# Patient Record
Sex: Male | Born: 1970 | Race: White | Hispanic: No | Marital: Married | Smoking: Former smoker
Health system: Southern US, Community
[De-identification: ages and names within clinical notes are randomized; demographics above are authoritative.]

## PROBLEM LIST (undated history)

## (undated) DIAGNOSIS — T7840XA Allergy, unspecified, initial encounter: Secondary | ICD-10-CM

## (undated) DIAGNOSIS — K219 Gastro-esophageal reflux disease without esophagitis: Secondary | ICD-10-CM

## (undated) HISTORY — DX: Gastro-esophageal reflux disease without esophagitis: K21.9

## (undated) HISTORY — PX: VASECTOMY: SHX75

## (undated) HISTORY — DX: Allergy, unspecified, initial encounter: T78.40XA

---

## 2009-10-27 ENCOUNTER — Encounter: Admission: RE | Admit: 2009-10-27 | Discharge: 2009-10-27 | Payer: Self-pay | Admitting: Internal Medicine

## 2010-12-21 ENCOUNTER — Ambulatory Visit
Admission: RE | Admit: 2010-12-21 | Discharge: 2010-12-21 | Payer: Self-pay | Source: Home / Self Care | Attending: Internal Medicine | Admitting: Internal Medicine

## 2010-12-21 ENCOUNTER — Other Ambulatory Visit: Payer: Self-pay | Admitting: Internal Medicine

## 2010-12-21 DIAGNOSIS — F410 Panic disorder [episodic paroxysmal anxiety] without agoraphobia: Secondary | ICD-10-CM | POA: Insufficient documentation

## 2010-12-21 DIAGNOSIS — G4730 Sleep apnea, unspecified: Secondary | ICD-10-CM | POA: Insufficient documentation

## 2010-12-21 DIAGNOSIS — K219 Gastro-esophageal reflux disease without esophagitis: Secondary | ICD-10-CM | POA: Insufficient documentation

## 2010-12-21 DIAGNOSIS — J309 Allergic rhinitis, unspecified: Secondary | ICD-10-CM | POA: Insufficient documentation

## 2010-12-21 DIAGNOSIS — R030 Elevated blood-pressure reading, without diagnosis of hypertension: Secondary | ICD-10-CM | POA: Insufficient documentation

## 2010-12-21 LAB — CBC WITH DIFFERENTIAL/PLATELET
Basophils Absolute: 0 10*3/uL (ref 0.0–0.1)
Basophils Relative: 0.4 % (ref 0.0–3.0)
Eosinophils Absolute: 0.2 10*3/uL (ref 0.0–0.7)
Eosinophils Relative: 2.3 % (ref 0.0–5.0)
HCT: 46.8 % (ref 39.0–52.0)
Hemoglobin: 16.8 g/dL (ref 13.0–17.0)
Lymphocytes Relative: 27.5 % (ref 12.0–46.0)
Lymphs Abs: 1.9 10*3/uL (ref 0.7–4.0)
MCHC: 35.8 g/dL (ref 30.0–36.0)
MCV: 91.9 fl (ref 78.0–100.0)
Monocytes Absolute: 0.7 10*3/uL (ref 0.1–1.0)
Monocytes Relative: 9.7 % (ref 3.0–12.0)
Neutro Abs: 4.1 10*3/uL (ref 1.4–7.7)
Neutrophils Relative %: 60.1 % (ref 43.0–77.0)
Platelets: 221 10*3/uL (ref 150.0–400.0)
RBC: 5.1 Mil/uL (ref 4.22–5.81)
RDW: 12.5 % (ref 11.5–14.6)
WBC: 6.8 10*3/uL (ref 4.5–10.5)

## 2010-12-21 LAB — BASIC METABOLIC PANEL
BUN: 16 mg/dL (ref 6–23)
CO2: 29 mEq/L (ref 19–32)
Calcium: 9.9 mg/dL (ref 8.4–10.5)
Chloride: 103 mEq/L (ref 96–112)
Creatinine, Ser: 0.9 mg/dL (ref 0.4–1.5)
GFR: 105.59 mL/min (ref >60.00)
Glucose, Bld: 92 mg/dL (ref 70–99)
Potassium: 4.4 mEq/L (ref 3.5–5.1)
Sodium: 141 mEq/L (ref 135–145)

## 2010-12-21 LAB — TSH: TSH: 1.44 u[IU]/mL (ref 0.35–5.50)

## 2010-12-21 LAB — LIPID PANEL
Cholesterol: 226 mg/dL — ABNORMAL HIGH (ref 0–200)
HDL: 31.4 mg/dL — ABNORMAL LOW (ref >39.00)
Total CHOL/HDL Ratio: 7
Triglycerides: 201 mg/dL — ABNORMAL HIGH (ref 0.0–149.0)
VLDL: 40.2 mg/dL — ABNORMAL HIGH (ref 0.0–40.0)

## 2010-12-21 LAB — HEPATIC FUNCTION PANEL
ALT: 34 U/L (ref 0–53)
AST: 39 U/L — ABNORMAL HIGH (ref 0–37)
Albumin: 4.5 g/dL (ref 3.5–5.2)
Alkaline Phosphatase: 60 U/L (ref 39–117)
Bilirubin, Direct: 0.1 mg/dL (ref 0.0–0.3)
Total Bilirubin: 1.4 mg/dL — ABNORMAL HIGH (ref 0.3–1.2)
Total Protein: 8.1 g/dL (ref 6.0–8.3)

## 2010-12-21 LAB — LDL CHOLESTEROL, DIRECT: Direct LDL: 169.9 mg/dL

## 2010-12-22 ENCOUNTER — Encounter: Payer: Self-pay | Admitting: Internal Medicine

## 2010-12-31 IMAGING — CT CT PARANASAL SINUSES LIMITED
1 series · 10 of 12 positions shown, 13 images · non-contrast
Comparison: None.

CLINICAL DATA: Opacification right maxillary sinus.

CT PARANASAL SINUS LIMITED WITHOUT CONTRAST
TECHNIQUE: Multidetector CT images of the paranasal sinuses were
obtained in a single plane without contrast.

[cor soft * · axial · 0.29mm/px · z∈[-300,-205]mm · 10 of 12 slices shown, 13 images]
[im 2/12  brain]
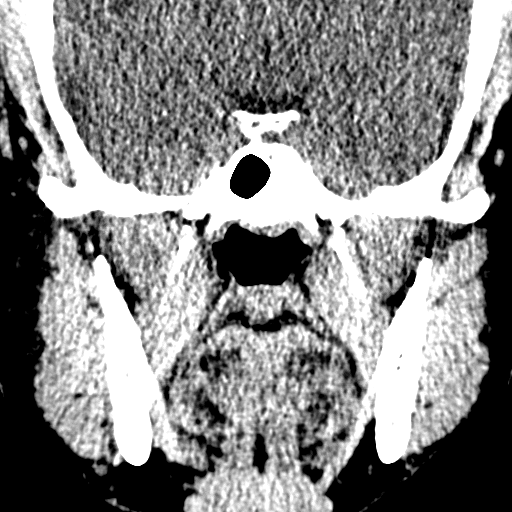
[im 2/12  bone]
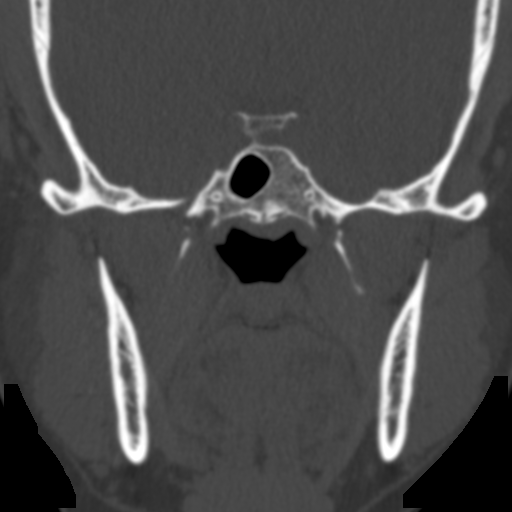
[im 3/12  bone]
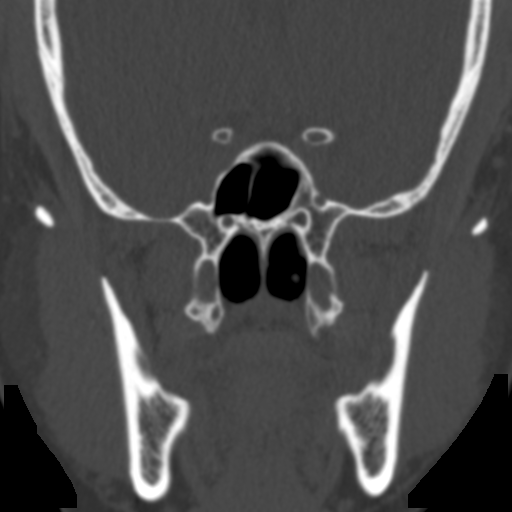
[im 4/12  bone]
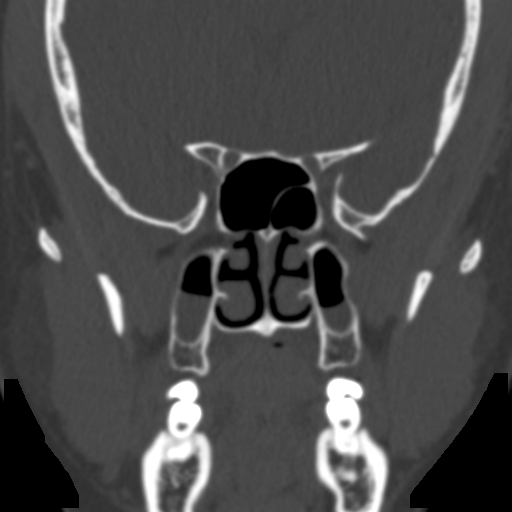
[im 5/12  bone]
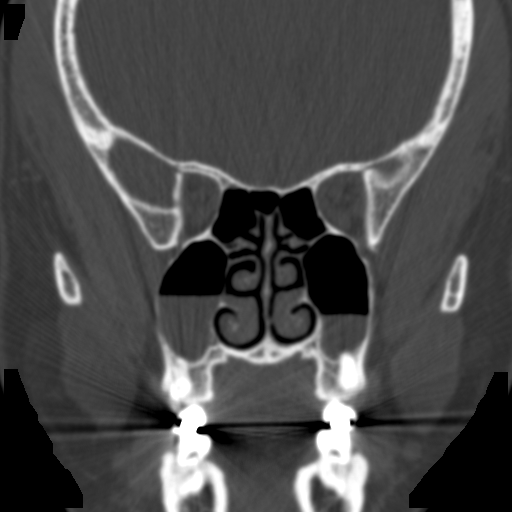
[im 6/12  brain]
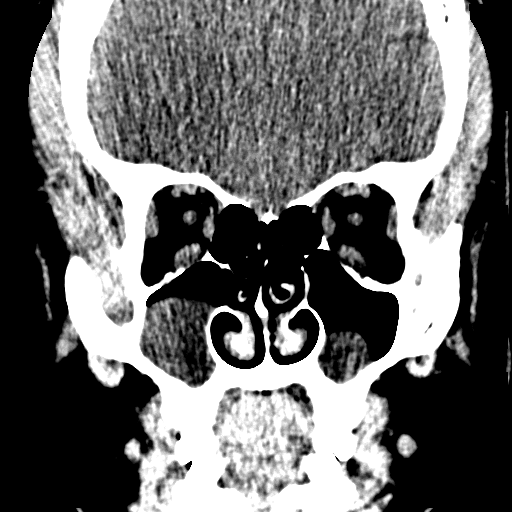
[im 6/12  bone]
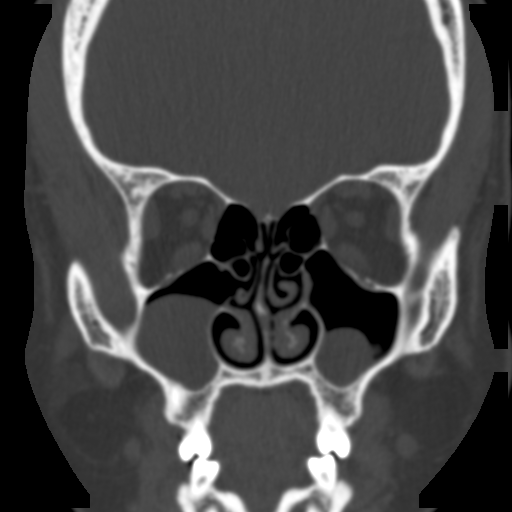
[im 7/12  bone]
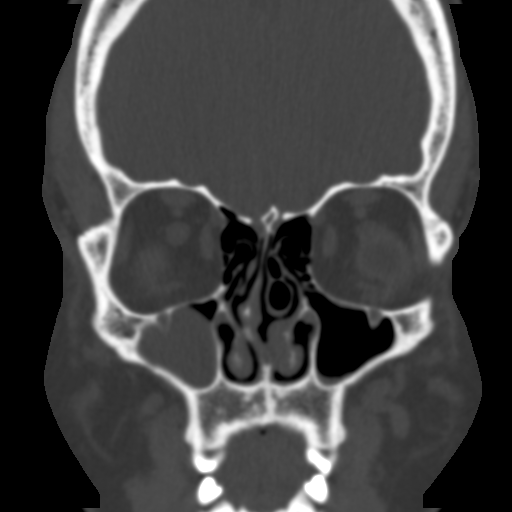
[im 8/12  bone]
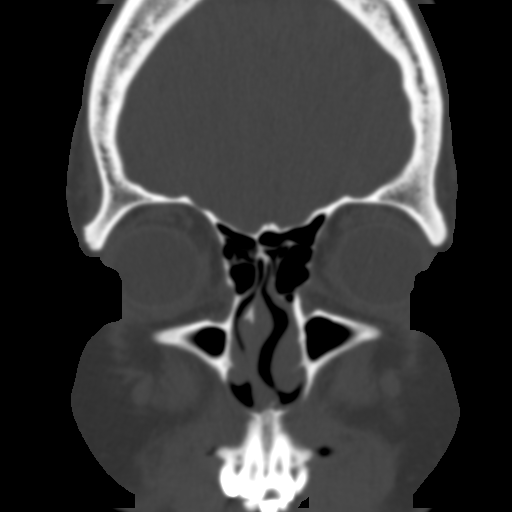
[im 9/12  bone]
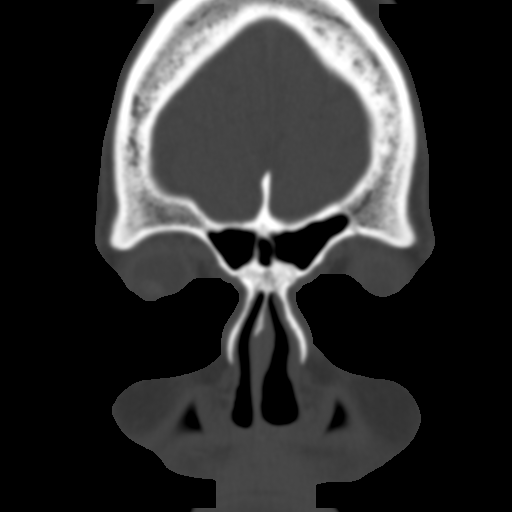
[im 10/12  brain]
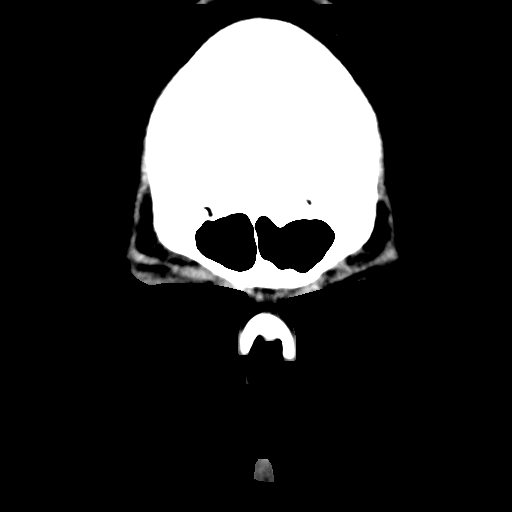
[im 10/12  bone]
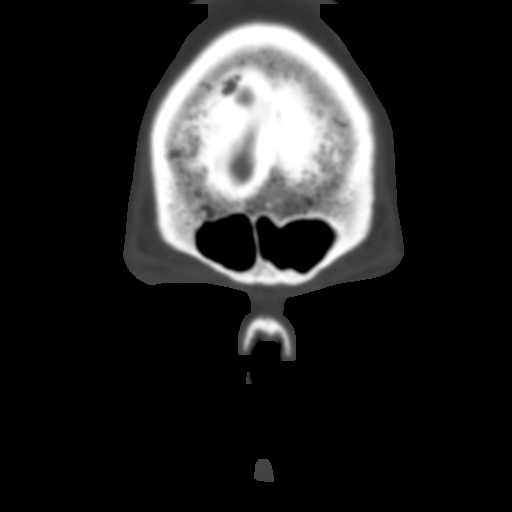
[im 11/12  bone]
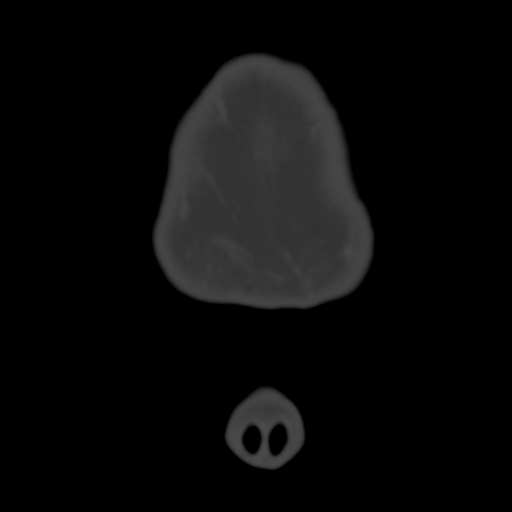

[10 of 12 positions shown; findings below may reference images not displayed]

FINDINGS: Polypoid opacification with air fluid level within the
maxillary sinuses.  This may represent a combination of retention
cyst/polyp in addition to sinusitis.
IMPRESSION: Polypoid opacification with air fluid level within the maxillary
sinuses.  This may represent a combination of retention cyst/polyp
in addition to sinusitis.

## 2011-01-05 ENCOUNTER — Ambulatory Visit
Admission: RE | Admit: 2011-01-05 | Discharge: 2011-01-05 | Payer: Self-pay | Source: Home / Self Care | Attending: Pulmonary Disease | Admitting: Pulmonary Disease

## 2011-01-05 DIAGNOSIS — R0989 Other specified symptoms and signs involving the circulatory and respiratory systems: Secondary | ICD-10-CM

## 2011-01-05 DIAGNOSIS — R0609 Other forms of dyspnea: Secondary | ICD-10-CM | POA: Insufficient documentation

## 2011-01-14 NOTE — Assessment & Plan Note (Signed)
Summary: NEW [employer] PT--#--PKG/OFF--STC   Vital Signs:  Patient profile:   40 year old male Height:      73 inches Weight:      277 pounds BMI:     36.68 O2 Sat:      95 % on Room air Temp:     98.6 degrees F oral Pulse rate:   79 / minute Pulse rhythm:   regular Resp:     16 per minute BP sitting:   142 / 84  (left arm) Cuff size:   large  Vitals Entered By: Rock Nephew CMA (December 21, 2010 1:29 PM)  Nutrition Counseling: Patient's BMI is greater than 25 and therefore counseled on weight management options.  O2 Flow:  Room air CC: New to establish Is Patient Diabetic? No Pain Assessment Patient in pain? no       Does patient need assistance? Functional Status Self care Ambulation Normal   Primary Care Provider:  Etta Grandchild MD  CC:  New to establish.  History of Present Illness: New to me he tells me that he has had a rare, sporadic feeling of panic for several years now with no preceeding event or behaviour that precipitates it. He feels very well in between the episodes. When the panic begins he has the sudden feeling of doom, with transient numbness in his arms, his wife says that his face can look pale or red, and he has the sensation that he is going to die. He does not feel anxious or depressed. The episodes spontaneously resolve.  Preventive Screening-Counseling & Management  Alcohol-Tobacco     Alcohol drinks/day: 3     Alcohol type: all     >5/day in last 3 mos: yes     Alcohol Counseling: to decrease amount and/or frequency of alcohol intake     Feels need to cut down: yes     Feels annoyed by complaints: no     Feels guilty re: drinking: no     Needs 'eye opener' in am: no     Smoking Status: never     Tobacco Counseling: not indicated; no tobacco use  Caffeine-Diet-Exercise     Does Patient Exercise: yes  Hep-HIV-STD-Contraception     Hepatitis Risk: no risk noted     HIV Risk: no risk noted     STD Risk: no risk noted      Sexual  History:  currently monogamous.        Drug Use:  no.        Blood Transfusions:  no.    Medications Prior to Update: 1)  None  Current Medications (verified): 1)  Zyrtec Allergy 10 Mg Tabs (Cetirizine Hcl) .... As Needed 2)  Zantac 150 Mg Tabs (Ranitidine Hcl) .... As Needed Otc 3)  Fluticasone Propionate 50 Mcg/act Susp (Fluticasone Propionate) .... 2 Puffs Each Nostril Once Daily 4)  Clonazepam 1 Mg Tabs (Clonazepam) .... 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  The patient denies anorexia, fever, weight loss, chest pain, syncope, dyspnea on exertion, peripheral edema, prolonged cough, headaches, hemoptysis, abdominal pain, melena, hematochezia, severe indigestion/heartburn, hematuria, suspicious skin lesions, difficulty walking, depression, unusual weight change, abnormal bleeding, and enlarged lymph nodes.   CV:  Denies chest pain or discomfort, difficulty breathing at night, difficulty breathing while lying down, fainting, fatigue, leg cramps with exertion, lightheadness, near fainting, palpitations, shortness of breath with exertion, and swelling of feet. Resp:  Complains of excessive snoring, hypersomnolence, and morning headaches; denies chest discomfort, chest pain with inspiration, cough, coughing up blood, pleuritic,  shortness of breath, sputum productive, and wheezing.  Physical Exam  General:  alert, well-developed, well-nourished, well-hydrated, appropriate dress, normal appearance, healthy-appearing, cooperative to examination, and overweight-appearing.   Head:  normocephalic, atraumatic, no abnormalities observed, and no abnormalities palpated.   Eyes:  vision grossly intact and pupils equal.   Ears:  R ear normal and L ear normal.   Mouth:  Oral mucosa and oropharynx without lesions or exudates.  Teeth in good repair. Neck:  supple, full ROM, no masses, no thyromegaly, no JVD, normal carotid upstroke, no carotid bruits, no cervical lymphadenopathy, and no neck tenderness.   Lungs:  normal respiratory effort, no intercostal retractions, no accessory muscle use, normal breath sounds, no dullness, no fremitus, no crackles, and no wheezes.   Heart:  normal rate, regular rhythm, no murmur, no gallop, no rub, and no JVD.   Abdomen:  soft, non-tender, normal bowel sounds, no distention, no masses, no guarding, no rigidity, no rebound tenderness, no abdominal hernia, no inguinal hernia, no hepatomegaly, and no splenomegaly.   Msk:  No deformity or scoliosis noted of thoracic or lumbar spine.   Pulses:  R and L carotid,radial,femoral,dorsalis pedis and posterior tibial pulses are full and equal bilaterally Extremities:  No clubbing, cyanosis, edema, or deformity noted with normal full range of motion of all joints.   Neurologic:  No cranial nerve deficits noted. Station and gait are normal. Plantar reflexes are down-going bilaterally. DTRs are symmetrical throughout. Sensory, motor and coordinative functions appear intact. Skin:  Intact without suspicious lesions or rashes Cervical Nodes:  No lymphadenopathy noted Axillary Nodes:  No palpable lymphadenopathy Psych:  Cognition and judgment appear intact. Alert and cooperative with normal attention span and concentration. No apparent delusions, illusions,  hallucinations Additional Exam:  His EKG has poor RWP that looks like a normal variant   Impression & Recommendations:  Problem # 1:  ELEVATED BP W/O HYPERTENSION (ICD-796.2) Assessment New  Orders: Venipuncture ([address]) TLB-Lipid Panel (80061-LIPID) TLB-BMP (Basic Metabolic Panel-BMET) (80048-METABOL) TLB-CBC Platelet - w/Differential (85025-CBCD) TLB-Hepatic/Liver Function Pnl (80076-HEPATIC) TLB-TSH (Thyroid Stimulating Hormone) (84443-TSH) EKG w/ Interpretation (93000)  BP today: 142/84  Instructed in low sodium diet (DASH Handout) and behavior modification.    Problem # 2:  PANIC DISORDER (ICD-300.01) Assessment: New  His updated medication list for this problem includes:    Clonazepam 1 Mg Tabs (Clonazepam) ..... One by mouth two times a day as needed for anxiety  Orders: Venipuncture (60454) TLB-Lipid Panel (80061-LIPID) TLB-BMP (Basic Metabolic Panel-BMET) (80048-METABOL) TLB-CBC Platelet - w/Differential (85025-CBCD) TLB-Hepatic/Liver Function Pnl (80076-HEPATIC) TLB-TSH (Thyroid Stimulating Hormone) (84443-TSH)  Problem # 3:  SLEEP APNEA (ICD-780.57) Assessment: New  Orders: Venipuncture (09811) TLB-Lipid Panel (80061-LIPID) TLB-BMP (Basic Metabolic Panel-BMET) (80048-METABOL) TLB-CBC Platelet - w/Differential (85025-CBCD) TLB-Hepatic/Liver Function Pnl (80076-HEPATIC) TLB-TSH (Thyroid  Stimulating Hormone) (84443-TSH) Sleep Disorder Referral (Sleep Disorder)  Problem # 4:  GERD (ICD-530.81) Assessment: Improved  His updated medication list for this problem includes:    Zantac 150 Mg Tabs (Ranitidine hcl) .Marland Kitchen... As needed otc  Complete Medication List: 1)  Zyrtec Allergy 10 Mg Tabs (Cetirizine hcl) .... As needed 2)  Zantac 150 Mg Tabs (Ranitidine hcl) .... As needed otc 3)  Fluticasone Propionate 50 Mcg/act Susp (Fluticasone propionate) .... 2 puffs each nostril once daily 4)  Clonazepam 1 Mg Tabs (Clonazepam) .... One by mouth two times a day as  needed for anxiety  Other Orders: Tdap => 46yrs IM ([address]) Admin 1st Vaccine (60454)  Patient Instructions: 1)  Please schedule a follow-up appointment in 2 weeks. 2)  It is important that you exercise regularly at least 20 minutes 5 times a week. If you develop chest pain, have severe difficulty breathing, or feel very tired , stop exercising immediately and seek medical attention. 3)  You need to lose weight. Consider a lower calorie diet and regular exercise.  4)  Check your Blood Pressure regularly. If it is above 140/90: you should make an appointment. Prescriptions: CLONAZEPAM 1 MG TABS (CLONAZEPAM) One by mouth two times a day as needed for anxiety  #60 x 2   Entered and Authorized by:   Etta Grandchild MD   Signed by:   Etta Grandchild MD on 12/21/2010   Method used:   Print then Give to Patient   RxID:   [id number] FLUTICASONE PROPIONATE 50 MCG/ACT SUSP (FLUTICASONE PROPIONATE) 2 puffs each nostril once daily  #1 inh x 11   Entered and Authorized by:   Etta Grandchild MD   Signed by:   Etta Grandchild MD on 12/21/2010   Method used:   Historical   RxID:   [id number]    Orders Added: 1)  Venipuncture [41324] 2)  TLB-Lipid Panel [80061-LIPID] 3)  TLB-BMP (Basic Metabolic Panel-BMET) [80048-METABOL] 4)  TLB-CBC Platelet - w/Differential [85025-CBCD] 5)  TLB-Hepatic/Liver Function Pnl [80076-HEPATIC] 6)  TLB-TSH (Thyroid Stimulating Hormone) [84443-TSH] 7)  Tdap => 72yrs IM [90715] 8)  Admin 1st Vaccine [90471] 9)  Sleep Disorder Referral [Sleep Disorder] 10)  EKG w/ Interpretation [93000] 11)  New Patient Level IV [40102]   Immunizations Administered:  Tetanus Vaccine:    Vaccine Type: Tdap    Site: left deltoid    Mfr: GlaxoSmithKline    Dose: 0.5 ml    Route: IM    Given by: Rock Nephew CMA    Exp. Date: 10/01/2012    Lot #: [id number]    VIS given: 10/30/08 version given December 21, 2010.   Immunizations Administered:  Tetanus Vaccine:     Vaccine Type: Tdap    Site: left deltoid    Mfr: GlaxoSmithKline    Dose: 0.5 ml    Route: IM    Given by: Rock Nephew CMA    Exp. Date: 10/01/2012    Lot #: [id number]    VIS given: 10/30/08 version given December 21, 2010.  Appended Document: NEW [employer] PT--#--PKG/OFF--STC    Clinical Lists Changes  Orders: Added new Service order of Tdap => 21yrs IM (75643) - Signed Added new Service order of Admin 1st Vaccine (32951) - Signed Observations: Added new observation of TD BOOST VIS: 10/30/08 version given December 22, 2010. (12/22/2010 8:41) Added new observation of TD BOOSTERLO: [id number] (12/22/2010 8:41) Added new observation of TD BOOST EXP: 10/01/2012 (12/22/2010 8:41) Added new observation  of TD BOOSTERBYAlvy Beal Archie CMA (12/22/2010 8:41) Added new observation of TD BOOSTERRT: IM (12/22/2010 8:41) Added new observation of TDBOOSTERDSE: 0.5 ml (12/22/2010 8:41) Added new observation of TD BOOSTERMF: GlaxoSmithKline (12/22/2010 8:41) Added new observation of TD BOOST SIT: right deltoid (12/22/2010 8:41) Added new observation of TD BOOSTER: Tdap (12/22/2010 8:41)       Immunizations Administered:  Tetanus Vaccine:    Vaccine Type: Tdap    Site: right deltoid    Mfr: GlaxoSmithKline    Dose: 0.5 ml    Route: IM    Given by: Rock Nephew CMA    Exp. Date: 10/01/2012    Lot #: [id number]    VIS given: 10/30/08 version given December 22, 2010.

## 2011-01-14 NOTE — Letter (Signed)
Summary: Lipid Letter  [facility]  [address] [place], [place] [address]   Phone: [phone]  Fax: [phone]    12/22/2010  Vernice Bowker [address] [place], [place]  [address]  Dear Casimiro Needle:  We have carefully reviewed your last lipid profile from  and the results are noted below with a summary of recommendations for lipid management.    Cholesterol:       226     Goal: <200 too high   HDL "good" Cholesterol:   57.84     Goal: >40 too low   LDL "bad" Cholesterol:   170     Goal: <130 too high   Triglycerides:       201.0     Goal: <150 too high        TLC Diet (Therapeutic Lifestyle Change): Saturated Fats & Transfatty acids should be kept < 7% of total calories ***Reduce Saturated Fats Polyunstaurated Fat can be up to 10% of total calories Monounsaturated Fat Fat can be up to 20% of total calories Total Fat should be no greater than 25-35% of total calories Carbohydrates should be 50-60% of total calories Protein should be approximately 15% of total calories Fiber should be at least 20-30 grams a day ***Increased fiber may help lower LDL Total Cholesterol should be < 200mg /day Consider adding plant stanol/sterols to diet (example: Benacol spread) ***A higher intake of unsaturated fat may reduce Triglycerides and Increase HDL    Adjunctive Measures (may lower LIPIDS and reduce risk of Heart Attack) include: Aerobic Exercise (20-30 minutes 3-4 times a week) Limit Alcohol Consumption Weight Reduction Aspirin 75-81 mg a day by mouth (if not allergic or contraindicated) Dietary Fiber 20-30 grams a day by mouth     Current Medications: 1)    Zyrtec Allergy 10 Mg Tabs (Cetirizine hcl) .... As needed 2)    Zantac 150 Mg Tabs (Ranitidine hcl) .... As needed otc 3)    Fluticasone Propionate 50 Mcg/act Susp (Fluticasone propionate) .... 2 puffs each nostril once daily 4)    Clonazepam 1 Mg Tabs (Clonazepam) .... One by mouth two times a day as needed for  anxiety  If you have any questions, please call. We appreciate being able to work with you.   Sincerely,    [facility] Etta Grandchild MD

## 2011-01-28 NOTE — Assessment & Plan Note (Signed)
Summary: consult for possible osa   Visit Type:  Initial Consult Copy to:  Dr. Sanda Linger Primary Provider/Referring Provider:  Etta Grandchild MD  CC:  Sleep Consultation.  History of Present Illness: The pt is a 40y/o male who I have been asked to see for possible osa.  He has been noted to have loud snoring, as well as an abnormal breathing pattern during sleep.  He also notes that he can awaken with his "heart racing".  He goes to bed around 11pm, and arises at 630am to start his day.  He feels that he is fairly rested upon arising.  He feels his alertness during the day is acceptable, but will have occasional sleep pressure with meetings.  He denies any sleep pressure with watching tv or movies in the evening.  He denies sleep pressure while driving.  His epworth score today is only 1.  His weight is up 40 pounds over the last 2 years.  Current Medications (verified): 1)  Zyrtec Allergy 10 Mg Tabs (Cetirizine Hcl) .... As Needed 2)  Zantac 150 Mg Tabs (Ranitidine Hcl) .... As Needed Otc 3)  Fluticasone Propionate 50 Mcg/act Susp (Fluticasone Propionate) .... 2 Puffs Each Nostril Once Daily 4)  Clonazepam 1 Mg Tabs (Clonazepam) .... One By Mouth Two Times A Day As Needed For Anxiety  Allergies (verified): No Known Drug Allergies  Past History:  Past Medical History: Reviewed history from 12/21/2010 and no changes required. Allergic rhinitis GERD  Past Surgical History: Vasectomy   Family History: Reviewed history from 12/21/2010 and no changes required. Family History of Arthritis Family History Diabetes 1st degree relative Allergies- Brother  Social History: Reviewed history from 12/21/2010 and no changes required. 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  Smoked for approx 11 yrs up to 2ppd Alcohol use-yes- 12 drinks per week Drug use-no Regular exercise-yes  Review of Systems       The patient complains of acid  heartburn, nasal congestion/difficulty breathing through nose, ear ache, and anxiety.  The patient denies shortness of breath with activity, shortness of breath at rest, productive cough, non-productive cough, coughing up blood, chest pain, irregular heartbeats, indigestion, loss of appetite, weight change, abdominal pain, difficulty swallowing, sore throat, tooth/dental problems, headaches, sneezing, itching, depression, hand/feet swelling, joint stiffness or pain, rash, change in color of mucus, and fever.    Vital Signs:  Patient profile:   40 year old male Weight:      283.25 pounds BMI:     37.51 O2 Sat:      97 % on Room air Temp:     98.1 degrees F oral Pulse rate:   78 / minute BP sitting:   134 / 88  (left arm) Cuff size:   large  Vitals Entered By: Vernie Murders (January 05, 2011 11:23 AM)  O2 Flow:  Room air  Physical Exam  General:  ow male in nad Eyes:  PERRLA and EOMI.   Nose:  congested with mucosal edema and deviated septum Mouth:  long palate and uvula with narrow space posteriorly Neck:  no jvd, tmg, LN Lungs:  clear to auscultation Heart:  rrr, no mrg Abdomen:  soft and nontender, bs+ Extremities:  no edema noted, pulses intact distally no cyanosis Neurologic:  alert and oriented, does not appear sleepy moves all 4 normally .   Impression & Recommendations:  Problem # 1:  SNORING (ICD-786.09) the pt has been noted to have  loud snoring, but has restorative sleep and reports no daytime sleepiness issues.  He really does not have a lot of underlying medical issues, and is really motivated to lose weight.  I suspect he has loud snoring, and possibly the upper airway resistance syndrome (UARS).  I cannot r/o mild osa, but I do not think he has clinically significant osa.  I think conservative management is adequate at this time, provided he is willing to followup if not making progress.  If he is able to lose weight, no further f/u needed.  If he is not able to make  progress with weight loss, would do home sleep testing.  Other Orders: Consultation Level IV ([address])  Patient Instructions: 1)  work on weight loss next 6mos.  If you decide that you are struggling with this, or if you are unsuccessful, will do a home sleep study.  Please let me know.

## 2011-02-04 ENCOUNTER — Encounter: Payer: Self-pay | Admitting: Internal Medicine

## 2011-02-04 ENCOUNTER — Telehealth (INDEPENDENT_AMBULATORY_CARE_PROVIDER_SITE_OTHER): Payer: Self-pay | Admitting: *Deleted

## 2011-02-04 ENCOUNTER — Other Ambulatory Visit: Payer: Self-pay | Admitting: Internal Medicine

## 2011-02-04 ENCOUNTER — Ambulatory Visit (INDEPENDENT_AMBULATORY_CARE_PROVIDER_SITE_OTHER): Payer: Managed Care, Other (non HMO) | Admitting: Internal Medicine

## 2011-02-04 ENCOUNTER — Ambulatory Visit (INDEPENDENT_AMBULATORY_CARE_PROVIDER_SITE_OTHER)
Admission: RE | Admit: 2011-02-04 | Discharge: 2011-02-04 | Disposition: A | Discharge status: Home / Self Care | Payer: Managed Care, Other (non HMO) | Source: Ambulatory Visit | Attending: Internal Medicine | Admitting: Internal Medicine

## 2011-02-04 DIAGNOSIS — R05 Cough: Secondary | ICD-10-CM

## 2011-02-04 DIAGNOSIS — R030 Elevated blood-pressure reading, without diagnosis of hypertension: Secondary | ICD-10-CM

## 2011-02-04 DIAGNOSIS — J209 Acute bronchitis, unspecified: Secondary | ICD-10-CM

## 2011-02-09 NOTE — Assessment & Plan Note (Signed)
Summary: dry sore throat   Vital Signs:  Patient profile:   40 year old male Height:      73 inches Weight:      280 pounds BMI:     37.08 O2 Sat:      99 % on Room air Temp:     97.6 degrees F oral Pulse rate:   84 / minute Pulse rhythm:   regular Resp:     16 per minute BP sitting:   120 / 80  (left arm) Cuff size:   large  Vitals Entered By: Lanier Prude, CMA(AAMA) (February 04, 2011 3:51 PM)  O2 Flow:  Room air CC: sore/dry throat & cough X 2 wks, URI symptoms Is Patient Diabetic? No Pain Assessment Patient in pain? no        Primary Care Provider:  Etta Grandchild MD  CC:  sore/dry throat & cough X 2 wks and URI symptoms.  History of Present Illness:  URI Symptoms      This is a 40 year old man who presents with URI symptoms.  The symptoms began 2 weeks ago.  The severity is described as mild.  The patient reports nasal congestion, clear nasal discharge, purulent nasal discharge, sore throat, and dry cough, but denies productive cough, earache, and sick contacts.  The patient denies fever, stiff neck, dyspnea, wheezing, rash, vomiting, diarrhea, use of an antipyretic, and response to antipyretic.  The patient also reports severe fatigue.  The patient denies itchy throat, sneezing, seasonal symptoms, headache, and muscle aches.  The patient denies the following risk factors for Strep sinusitis: unilateral facial pain, unilateral nasal discharge, poor response to decongestant, double sickening, tooth pain, Strep exposure, tender adenopathy, and absence of cough.    Preventive Screening-Counseling & Management  Alcohol-Tobacco     Alcohol drinks/day: 3     Alcohol type: all     >5/day in last 3 mos: yes     Alcohol Counseling: to decrease amount and/or frequency of alcohol intake     Feels need to cut down: yes     Feels annoyed by complaints: no     Feels guilty re: drinking: no     Needs 'eye opener' in am: no     Smoking Status: never     Tobacco Counseling:  not indicated; no tobacco use  Hep-HIV-STD-Contraception     Hepatitis Risk: no risk noted     HIV Risk: no risk noted     STD Risk: no risk noted      Sexual History:  currently monogamous.        Drug Use:  no.        Blood Transfusions:  no.    Clinical Review Panels:  Immunizations   Last Tetanus Booster:  Tdap (12/22/2010)  Lipid Management   Cholesterol:  226 (12/21/2010)   HDL (good cholesterol):  31.40 (12/21/2010)  Diabetes Management   Creatinine:  0.9 (12/21/2010)  CBC   WBC:  6.8 (12/21/2010)   RBC:  5.10 (12/21/2010)   Hgb:  16.8 (12/21/2010)   Hct:  46.8 (12/21/2010)   Platelets:  221.0 (12/21/2010)   MCV  91.9 (12/21/2010)   MCHC  35.8 (12/21/2010)   RDW  12.5 (12/21/2010)   PMN:  60.1 (12/21/2010)   Lymphs:  27.5 (12/21/2010)   Monos:  9.7 (12/21/2010)   Eosinophils:  2.3 (12/21/2010)   Basophil:  0.4 (12/21/2010)  Complete Metabolic Panel   Glucose:  92 (12/21/2010)   Sodium:  141 (  12/21/2010)   Potassium:  4.4 (12/21/2010)   Chloride:  103 (12/21/2010)   CO2:  29 (12/21/2010)   BUN:  16 (12/21/2010)   Creatinine:  0.9 (12/21/2010)   Albumin:  4.5 (12/21/2010)   Total Protein:  8.1 (12/21/2010)   Calcium:  9.9 (12/21/2010)   Total Bili:  1.4 (12/21/2010)   Alk Phos:  60 (12/21/2010)   SGPT (ALT):  34 (12/21/2010)   SGOT (AST):  39 (12/21/2010)   Medications Prior to Update: 1)  Zyrtec Allergy 10 Mg Tabs (Cetirizine Hcl) .... As Needed 2)  Zantac 150 Mg Tabs (Ranitidine Hcl) .... As Needed Otc 3)  Fluticasone Propionate 50 Mcg/act Susp (Fluticasone Propionate) .... 2 Puffs Each Nostril Once Daily 4)  Clonazepam 1 Mg Tabs (Clonazepam) .... One By Mouth Two Times A Day As Needed For Anxiety  Current Medications (verified): 1)  Zyrtec Allergy 10 Mg Tabs (Cetirizine Hcl) .... As Needed 2)  Zantac 150 Mg Tabs (Ranitidine Hcl) .... As Needed Otc 3)  Fluticasone Propionate 50 Mcg/act Susp (Fluticasone Propionate) .... 2 Puffs Each Nostril  Once Daily 4)  Clonazepam 1 Mg Tabs (Clonazepam) .... One By Mouth Two Times A Day As Needed For Anxiety 5)  Rezira 60-5 Mg/57ml Soln (Pseudoeph-Hydrocodone) .... 5 Ml By Mouth Qid As Needed For Cough and Congestion 6)  Zithromax Tri-Pak 500 Mg Tab (Azithromycin) .... 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  Smoked for approx 11 yrs up to 2ppd Alcohol use-yes- 12 drinks per week Drug use-no Regular exercise-yes  Risk Factors: Alcohol Use: 3 (02/04/2011) >5 drinks/d w/in last 3 months: yes (02/04/2011) Exercise: yes (12/21/2010)  Risk Factors: Smoking Status: never (02/04/2011)  Family History: Reviewed history from 01/05/2011 and no changes required. Family History of Arthritis Family History Diabetes 1st degree relative Allergies- Brother  Social History: Reviewed history from 01/05/2011 and no changes required. 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  Smoked for approx 11 yrs up to 2ppd Alcohol use-yes- 12 drinks per week Drug use-no Regular exercise-yes  Review of Systems       The patient complains of hoarseness and prolonged cough.  The patient denies anorexia, fever, weight loss, weight gain, decreased hearing, chest pain, syncope, dyspnea on exertion, peripheral edema, headaches, hemoptysis, abdominal pain, suspicious skin lesions, difficulty walking, depression, and enlarged lymph nodes.    Physical Exam  General:  alert, well-developed,  well-nourished, well-hydrated, appropriate dress, normal appearance, healthy-appearing, cooperative to examination, and overweight-appearing.   Head:  normocephalic, atraumatic, no abnormalities observed, and no abnormalities palpated.   Ears:  R ear normal and L ear normal.   Nose:  External nasal examination shows no deformity or inflammation. Nasal mucosa are pink and moist without lesions or exudates. Mouth:  Oral mucosa and oropharynx without lesions or exudates.  Teeth in good repair. Neck:  supple, full ROM, no masses, no thyromegaly, no JVD, normal carotid upstroke, no carotid bruits, no cervical lymphadenopathy, and no neck tenderness.   Lungs:  normal respiratory effort, no intercostal retractions, no accessory muscle use, normal breath  sounds, no dullness, no fremitus, no crackles, and no wheezes.   Heart:  normal rate, regular rhythm, no murmur, no gallop, no rub, and no JVD.   Abdomen:  soft, non-tender, normal bowel sounds, no distention, no masses, no guarding, no rigidity, no rebound tenderness, no abdominal hernia, no inguinal hernia, no hepatomegaly, and no splenomegaly.   Msk:  No deformity or scoliosis noted of thoracic or lumbar spine.   Pulses:  R and L carotid,radial,femoral,dorsalis pedis and posterior tibial pulses are full and equal bilaterally Extremities:  No clubbing, cyanosis, edema, or deformity noted with normal full range of motion of all joints.   Neurologic:  No cranial nerve deficits noted. Station and gait are normal. Plantar reflexes are down-going bilaterally. DTRs are symmetrical throughout. Sensory, motor and coordinative functions appear intact. Skin:  Intact without suspicious lesions or rashes Cervical Nodes:  No lymphadenopathy noted Axillary Nodes:  No palpable lymphadenopathy Psych:  Cognition and judgment appear intact. Alert and cooperative with normal attention span and concentration. No apparent delusions, illusions, hallucinations   Impression &  Recommendations:  Problem # 1:  BRONCHITIS-ACUTE (ICD-466.0) Assessment New  His updated medication list for this problem includes:    Rezira 60-5 Mg/60ml Soln (Pseudoeph-hydrocodone) .Marland KitchenMarland KitchenMarland KitchenMarland Kitchen 5 ml by mouth qid as needed for cough and congestion    Zithromax Tri-pak 500 Mg Tab (Azithromycin) .Marland Kitchen... Take one by mouth once daily for 3 days  Take antibiotics and other medications as directed. Encouraged to push clear liquids, get enough rest, and take acetaminophen as needed. To be seen in 5-7 days if no improvement, sooner if worse.  Problem # 2:  COUGH (ICD-786.2) Assessment: New will check for pneumonia Orders: T-2 View CXR (71020TC)  Problem # 3:  ELEVATED BP W/O HYPERTENSION (ICD-796.2) Assessment: Improved  BP today: 120/80 Prior BP: 134/88 (01/05/2011)  Labs Reviewed: Creat: 0.9 (12/21/2010) Chol: 226 (12/21/2010)   HDL: 31.40 (12/21/2010)   TG: 201.0 (12/21/2010)  Instructed in low sodium diet (DASH Handout) and behavior modification.    Complete Medication List: 1)  Zyrtec Allergy 10 Mg Tabs (Cetirizine hcl) .... As needed 2)  Zantac 150 Mg Tabs (Ranitidine hcl) .... As needed otc 3)  Fluticasone Propionate 50 Mcg/act Susp (Fluticasone propionate) .... 2 puffs each nostril once daily 4)  Clonazepam 1 Mg Tabs (Clonazepam) .... One by mouth two times a day as needed for anxiety 5)  Rezira 60-5 Mg/90ml Soln (Pseudoeph-hydrocodone) .... 5 ml by mouth qid as needed for cough and congestion 6)  Zithromax Tri-pak 500 Mg Tab (Azithromycin) .... Take one by mouth once daily for 3 days  Patient Instructions: 1)  Please schedule a follow-up appointment in 2 weeks. 2)  Take your antibiotic as prescribed until ALL of it is gone, but stop if you develop a rash or swelling and contact our office as soon as possible. 3)  Acute bronchitis symptoms for less than 10 days are not helped by antibiotics. take over the counter cough medications. call if no improvment in  5-7 days, sooner if  increasing cough, fever, or new symptoms( shortness of breath, chest pain). Prescriptions: ZITHROMAX TRI-PAK 500 MG TAB (AZITHROMYCIN) Take one by mouth once daily for 3 days  #3 x 0   Entered and Authorized by:   Etta Grandchild MD   Signed by:   Etta Grandchild MD on 02/04/2011   Method used:   Print then Give to Patient   RxID:   [id number] REZIRA 60-5 MG/5ML SOLN (PSEUDOEPH-HYDROCODONE) 5 ml by  mouth QID as needed for cough and congestion  #8 ounces x 0   Entered and Authorized by:   Etta Grandchild MD   Signed by:   Etta Grandchild MD on 02/04/2011   Method used:   Print then Give to Patient   RxID:   [id number]    Orders Added: 1)  T-2 View CXR [71020TC] 2)  Est. Patient Level IV [14782]

## 2011-02-09 NOTE — Progress Notes (Signed)
  Phone Note Call from Patient Call back at Home Phone [phone]   Caller: Patient----[phone] Call For: Dr Yetta Barre Summary of Call: Dry cough, sore throat, has taken Mucinex but stopped. Pt has had this for about 2 wks, can he get antibiotic? or does he need o.v.?(he requested we ask) Initial call taken by: Verdell Face,  February 04, 2011 8:21 AM  Follow-up for Phone Call        needs to be seen with a chest xray Follow-up by: Etta Grandchild MD,  February 04, 2011 8:25 AM  Additional Follow-up for Phone Call Additional follow up Details #1::        I called pt back to sched office visit, he states it is mainly his throat, very dry. He now states it is not in his chest, he is not going to do a chest xray but sched appt for today @3 :30pm. Additional Follow-up by: Verdell Face,  February 04, 2011 9:17 AM

## 2011-02-11 ENCOUNTER — Ambulatory Visit (INDEPENDENT_AMBULATORY_CARE_PROVIDER_SITE_OTHER): Payer: Managed Care, Other (non HMO) | Admitting: Internal Medicine

## 2011-02-11 ENCOUNTER — Encounter: Payer: Self-pay | Admitting: Internal Medicine

## 2011-02-11 DIAGNOSIS — M109 Gout, unspecified: Secondary | ICD-10-CM

## 2011-02-17 ENCOUNTER — Telehealth: Payer: Self-pay | Admitting: Internal Medicine

## 2011-02-18 NOTE — Assessment & Plan Note (Signed)
Summary: TOE PAIN /NWS #   Vital Signs:  Patient profile:   40 year old male Height:      73 inches Weight:      279 pounds BMI:     36.94 O2 Sat:      96 % on Room air Temp:     97.8 degrees F oral Pulse rate:   78 / minute Pulse rhythm:   regular Resp:     16 per minute BP sitting:   118 / 76  (left arm) Cuff size:   large  Vitals Entered By: Rock Nephew CMA (February 11, 2011 1:46 PM)  Nutrition Counseling: Patient's BMI is greater than 25 and therefore counseled on weight management options.  O2 Flow:  Room air CC: Patient c/o L foot pain and swelling x several days  Does patient need assistance? Functional Status Self care Ambulation Normal   Primary Care Provider:  Etta Grandchild MD  CC:  Patient c/o L foot pain and swelling x several days.  History of Present Illness: He returns c/o pain, redness, and swelling in his left foot for 2 weeks. He has not had an injury or trauma.  Current Medications (verified): 1)  Zyrtec Allergy 10 Mg Tabs (Cetirizine Hcl) .... As Needed 2)  Zantac 150 Mg Tabs (Ranitidine Hcl) .... As Needed Otc 3)  Fluticasone Propionate 50 Mcg/act Susp (Fluticasone Propionate) .... 2 Puffs Each Nostril Once Daily 4)  Clonazepam 1 Mg Tabs (Clonazepam) .... One By Mouth Two Times A Day As Needed For Anxiety 5)  Rezira 60-5 Mg/42ml Soln (Pseudoeph-Hydrocodone) .... 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  Smoked for approx 11 yrs up to 2ppd Alcohol use-yes- 12 drinks  per week Drug use-no Regular exercise-yes  Risk Factors: Alcohol Use: 3 (02/04/2011) >5 drinks/d w/in last 3 months: yes (02/04/2011) Exercise: yes (12/21/2010)  Risk Factors: Smoking Status: never (02/04/2011)  Family History: Reviewed history from 01/05/2011 and no changes required. Family History of Arthritis Family History Diabetes 1st degree relative Allergies- Brother  Social History: Reviewed history from 01/05/2011 and no changes required. 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  Smoked for approx 11 yrs up to 2ppd Alcohol use-yes- 12 drinks per week Drug use-no Regular exercise-yes  Review of Systems MS:  Complains of joint pain, joint redness, and joint swelling; denies loss of strength, muscle aches, muscle weakness, and stiffness.  Physical Exam  General:  alert, well-developed, well-nourished, well-hydrated, appropriate dress, normal appearance, healthy-appearing, cooperative to examination, and overweight-appearing.   Mouth:  Oral mucosa and oropharynx without lesions or exudates.  Teeth in good repair. Neck:  supple, full ROM, no masses, no thyromegaly, no JVD, normal carotid upstroke, no carotid bruits, no cervical lymphadenopathy, and no neck tenderness.   Lungs:  normal respiratory effort, no intercostal retractions, no accessory muscle use, normal breath sounds, no dullness, no fremitus, no crackles, and no wheezes.   Heart:  normal rate, regular rhythm, no murmur, no gallop, no rub, and no JVD.   Abdomen:  soft,  non-tender, normal bowel sounds, no distention, no masses, no guarding, no rigidity, no rebound tenderness, no abdominal hernia, no inguinal hernia, no hepatomegaly, and no splenomegaly.   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 the joints have FROM. Pulses:  R and L carotid,radial,femoral,dorsalis pedis and posterior  tibial pulses are full and equal bilaterally Extremities:  No clubbing, cyanosis, edema, or deformity noted with normal full range of motion of all joints.   Neurologic:  No cranial nerve deficits noted. Station and gait are normal. Plantar reflexes are down-going bilaterally. DTRs are symmetrical throughout. Sensory, motor and coordinative functions appear intact. Skin:  Intact without suspicious lesions or rashes Cervical Nodes:  No lymphadenopathy noted Psych:  Cognition and judgment appear intact. Alert and cooperative with normal attention span and concentration. No apparent delusions, illusions, hallucinations   Impression & Recommendations:  Problem # 1:  ACUTE GOUTY ARTHROPATHY (ICD-274.01) Assessment New  His updated medication list for this problem includes:    Colcrys 0.6 Mg Tabs (Colchicine) ..... One by mouth two times a day as needed for gout pain  Orders: Admin of Therapeutic Inj  intramuscular or subcutaneous (16109) Depo- Medrol 40mg  (J1030) Depo- Medrol 80mg  (J1040)  Complete Medication List: 1)  Zyrtec Allergy 10 Mg Tabs (Cetirizine hcl) .... As needed 2)  Zantac 150 Mg Tabs (Ranitidine hcl) .... As needed otc 3)  Fluticasone Propionate 50 Mcg/act Susp (Fluticasone propionate) .... 2 puffs each nostril once daily 4)  Clonazepam 1 Mg Tabs (Clonazepam) .... One by mouth two times a day as needed for anxiety 5)  Rezira 60-5 Mg/2ml Soln (Pseudoeph-hydrocodone) .... 5 ml by mouth qid as needed for cough and congestion 6)  Indomethacin 50 Mg Caps (Indomethacin) .... One by mouth three times a day as needed for joint pain 7)  Colcrys 0.6 Mg Tabs (Colchicine) .... One by mouth two times a day as needed for gout pain 8)  Percocet 7.5-325 Mg Tabs (Oxycodone-acetaminophen) .... One by mouth qid as needed for pain  Patient Instructions: 1)  Please schedule a follow-up appointment in 1 month. 2)  To prevent gout attacks,avoid purine rich foods, such as beer, beans & peas, and  meat gravies. 3)  It is important that you exercise regularly at least 20 minutes 5 times a week. If you develop chest pain, have severe difficulty breathing, or feel very tired , stop exercising immediately and seek medical attention. 4)  You need to lose weight. Consider a lower calorie diet and regular exercise.  5)  You may move around but avoid painful motions. Apply ice to sore area for 20 minutes 3-4 times a day for 2-3 days. Prescriptions: PERCOCET 7.5-325 MG TABS (OXYCODONE-ACETAMINOPHEN) One by mouth QID as needed for pain  #50 x 0   Entered and Authorized by:   Etta Grandchild MD   Signed by:   Etta Grandchild MD on 02/11/2011   Method used:   Print then Give to Patient   RxID:   [id number] COLCRYS 0.6 MG TABS (COLCHICINE) One by mouth two times a day as needed for gout pain  #60 x 11   Entered and Authorized by:   Etta Grandchild MD   Signed by:   Etta Grandchild MD on 02/11/2011   Method used:   Print then Give to Patient   RxID:   [id number] INDOMETHACIN 50 MG CAPS (INDOMETHACIN) One by mouth three  times a day as needed for joint pain  #90 x 3   Entered and Authorized by:   Etta Grandchild MD   Signed by:   Etta Grandchild MD on 02/11/2011   Method used:   Print then Give to Patient   RxID:   [id number]    Medication Administration  Injection # 1:    Medication: Depo- Medrol 80mg     Diagnosis: ACUTE GOUTY ARTHROPATHY (ICD-274.01)    Route: IM    Site: L deltoid    Exp Date: 06/2013    Lot #: [id number]    Mfr: pfizer    Patient tolerated injection without complications    Given by: Rock Nephew CMA (February 11, 2011 2:04 PM)  Injection # 2:    Medication: Depo- Medrol 40mg     Diagnosis: ACUTE GOUTY ARTHROPATHY (ICD-274.01)    Route: IM    Site: L deltoid    Exp Date: 06/2013    Mfr: pfizer    Patient tolerated injection without complications    Given by: Rock Nephew CMA (February 11, 2011 2:04 PM)  Orders Added: 1)  Admin of Therapeutic Inj   intramuscular or subcutaneous [96372] 2)  Depo- Medrol 40mg  [J1030] 3)  Depo- Medrol 80mg  [J1040] 4)  Est. Patient Level IV [14782]     Medication Administration  Injection # 1:    Medication: Depo- Medrol 80mg     Diagnosis: ACUTE GOUTY ARTHROPATHY (ICD-274.01)    Route: IM    Site: L deltoid    Exp Date: 06/2013    Lot #: [id number]    Mfr: pfizer    Patient tolerated injection without complications    Given by: Rock Nephew CMA (February 11, 2011 2:04 PM)  Injection # 2:    Medication: Depo- Medrol 40mg     Diagnosis: ACUTE GOUTY ARTHROPATHY (ICD-274.01)    Route: IM    Site: L deltoid    Exp Date: 06/2013    Mfr: pfizer    Patient tolerated injection without complications    Given by: Rock Nephew CMA (February 11, 2011 2:04 PM)  Orders Added: 1)  Admin of Therapeutic Inj  intramuscular or subcutaneous [96372] 2)  Depo- Medrol 40mg  [J1030] 3)  Depo- Medrol 80mg  [J1040] 4)  Est. Patient Level IV [95621]

## 2011-02-23 NOTE — Progress Notes (Signed)
Summary: GOUT MED  Phone Note Call from Patient   Summary of Call: Pt called and wanted to know when and if he could stop the 2 meds given for gout. I advised him that both were written to use as needed gout pain/flare and if symptoms were gone he may stop.  Initial call taken by: Lamar Sprinkles, CMA,  February 17, 2011 9:39 AM

## 2011-04-14 ENCOUNTER — Encounter: Payer: Self-pay | Admitting: Internal Medicine

## 2011-04-14 ENCOUNTER — Ambulatory Visit (INDEPENDENT_AMBULATORY_CARE_PROVIDER_SITE_OTHER): Payer: Managed Care, Other (non HMO) | Admitting: Internal Medicine

## 2011-04-14 VITALS — BP 126/70 | HR 74 | Temp 98.0°F | Resp 16 | Wt 274.0 lb

## 2011-04-14 DIAGNOSIS — M6740 Ganglion, unspecified site: Secondary | ICD-10-CM

## 2011-04-15 ENCOUNTER — Encounter: Payer: Self-pay | Admitting: Internal Medicine

## 2011-04-15 DIAGNOSIS — M6740 Ganglion, unspecified site: Secondary | ICD-10-CM | POA: Insufficient documentation

## 2011-04-15 NOTE — Progress Notes (Signed)
  Subjective:    Patient ID: Louis Short, male    DOB: 1971-06-01, 40 y.o.   MRN: [id number]  HPI He returns complaining that he has noticed a "cyst" over his right ankle for several weeks, it does not bother him.   Review of Systems  Musculoskeletal: Negative for myalgias, back pain, joint swelling, arthralgias and gait problem.       Objective:   Physical Exam  Musculoskeletal:       Over the right lateral malleolus there is a SQ lesion that feels firm and solid, it measures about 1 cm and it moves freely with any range of motion in the foot/ankle. It is not tender, not swollen, and there is no warmth/erythema/induration/fluctuance.          Assessment & Plan:

## 2011-04-15 NOTE — Assessment & Plan Note (Signed)
I informed him that this is a benign condition and he was reassured, he tells me that it does not bother him and for now he does not want to do anything about it, if it bothers him in any way or gets any larger then I will refer him to an ortho (foot) doctor.

## 2011-09-03 ENCOUNTER — Telehealth: Payer: Self-pay | Admitting: *Deleted

## 2011-09-03 NOTE — Telephone Encounter (Signed)
Patient requesting a call back regarding a RF

## 2011-09-03 NOTE — Telephone Encounter (Signed)
Please advise if ok to refill clonazepam 1mg  bid

## 2011-09-03 NOTE — Telephone Encounter (Signed)
Patient left VM requesting a call back regarding RF

## 2011-09-03 NOTE — Telephone Encounter (Signed)
sure

## 2011-09-06 MED ORDER — CLONAZEPAM 1 MG PO TABS: 1.0000 mg | ORAL_TABLET | Freq: Two times a day (BID) | ORAL | Status: DC | PRN

## 2011-09-07 ENCOUNTER — Telehealth: Payer: Self-pay | Admitting: *Deleted

## 2011-09-07 NOTE — Telephone Encounter (Signed)
Spoke w/pt. He has gout flare now and c/o pain. He has restarted the colchine & allopurinol and has started to see some relief. Pt has 3 pills of the oxycodone/apap left and req additional RX to help with pain. I advised patient he would need OV for additional pain meds. He declined to schedule. Advised patient to come into the office with any change in symptoms.

## 2012-05-03 ENCOUNTER — Telehealth: Payer: Self-pay | Admitting: Internal Medicine

## 2012-05-03 NOTE — Telephone Encounter (Signed)
Needs to be seen

## 2012-05-03 NOTE — Telephone Encounter (Signed)
The pt called and is hoping to get a refill of clonazepam 1mg  sent to [facility] on [address].   Thanks!

## 2012-05-04 NOTE — Telephone Encounter (Signed)
Called pt to offer same day slot, he stated he would have to wait until the 31st (only day off work).  I advised pt there no regular openings, but to call back and check cancellations.

## 2012-05-19 ENCOUNTER — Encounter: Payer: Self-pay | Admitting: Internal Medicine

## 2012-05-19 ENCOUNTER — Ambulatory Visit (INDEPENDENT_AMBULATORY_CARE_PROVIDER_SITE_OTHER): Payer: Managed Care, Other (non HMO) | Admitting: Internal Medicine

## 2012-05-19 ENCOUNTER — Other Ambulatory Visit (INDEPENDENT_AMBULATORY_CARE_PROVIDER_SITE_OTHER): Payer: Managed Care, Other (non HMO)

## 2012-05-19 VITALS — BP 112/80 | HR 80 | Temp 98.1°F | Resp 16 | Wt 266.0 lb

## 2012-05-19 DIAGNOSIS — E782 Mixed hyperlipidemia: Secondary | ICD-10-CM

## 2012-05-19 DIAGNOSIS — F410 Panic disorder [episodic paroxysmal anxiety] without agoraphobia: Secondary | ICD-10-CM

## 2012-05-19 LAB — COMPREHENSIVE METABOLIC PANEL
Alkaline Phosphatase: 54 U/L (ref 39–117)
BUN: 18 mg/dL (ref 6–23)
GFR: 109.31 mL/min (ref >60.00)
Glucose, Bld: 93 mg/dL (ref 70–99)
Total Bilirubin: 1.5 mg/dL — ABNORMAL HIGH (ref 0.3–1.2)

## 2012-05-19 LAB — LIPID PANEL
Cholesterol: 162 mg/dL (ref 0–200)
HDL: 32.6 mg/dL — ABNORMAL LOW (ref >39.00)
LDL Cholesterol: 94 mg/dL (ref 0–99)
VLDL: 35.8 mg/dL (ref 0.0–40.0)

## 2012-05-19 MED ORDER — CLONAZEPAM 1 MG PO TABS: 1.0000 mg | ORAL_TABLET | Freq: Two times a day (BID) | ORAL | Status: DC | PRN

## 2012-05-19 NOTE — Assessment & Plan Note (Signed)
It sounds like this is better, he rarely takes the benzo and he has no s/s suggestive of depression

## 2012-05-19 NOTE — Patient Instructions (Signed)
Hypertriglyceridemia  Diet for High blood levels of Triglycerides Most fats in food are triglycerides. Triglycerides in your blood are stored as fat in your body. High levels of triglycerides in your blood may put you at a greater risk for heart disease and stroke.  Normal triglyceride levels are less than 150 mg/dL. Borderline high levels are 150-199 mg/dl. High levels are 200 - 499 mg/dL, and very high triglyceride levels are greater than 500 mg/dL. The decision to treat high triglycerides is generally based on the level. For people with borderline or high triglyceride levels, treatment includes weight loss and exercise. Drugs are recommended for people with very high triglyceride levels. Many people who need treatment for high triglyceride levels have metabolic syndrome. This syndrome is a collection of disorders that often include: insulin resistance, high blood pressure, blood clotting problems, high cholesterol and triglycerides. TESTING PROCEDURE FOR TRIGLYCERIDES  You should not eat 4 hours before getting your triglycerides measured. The normal range of triglycerides is between 10 and 250 milligrams per deciliter (mg/dl). Some people may have extreme levels (1000 or above), but your triglyceride level may be too high if it is above 150 mg/dl, depending on what other risk factors you have for heart disease.   People with high blood triglycerides may also have high blood cholesterol levels. If you have high blood cholesterol as well as high blood triglycerides, your risk for heart disease is probably greater than if you only had high triglycerides. High blood cholesterol is one of the main risk factors for heart disease.  CHANGING YOUR DIET  Your weight can affect your blood triglyceride level. If you are more than 20% above your ideal body weight, you may be able to lower your blood triglycerides by losing weight. Eating less and exercising regularly is the best way to combat this. Fat provides  more calories than any other food. The best way to lose weight is to eat less fat. Only 30% of your total calories should come from fat. Less than 7% of your diet should come from saturated fat. A diet low in fat and saturated fat is the same as a diet to decrease blood cholesterol. By eating a diet lower in fat, you may lose weight, lower your blood cholesterol, and lower your blood triglyceride level.  Eating a diet low in fat, especially saturated fat, may also help you lower your blood triglyceride level. Ask your dietitian to help you figure how much fat you can eat based on the number of calories your caregiver has prescribed for you.  Exercise, in addition to helping with weight loss may also help lower triglyceride levels.   Alcohol can increase blood triglycerides. You may need to stop drinking alcoholic beverages.   Too much carbohydrate in your diet may also increase your blood triglycerides. Some complex carbohydrates are necessary in your diet. These may include bread, rice, potatoes, other starchy vegetables and cereals.   Reduce "simple" carbohydrates. These may include pure sugars, candy, honey, and jelly without losing other nutrients. If you have the kind of high blood triglycerides that is affected by the amount of carbohydrates in your diet, you will need to eat less sugar and less high-sugar foods. Your caregiver can help you with this.   Adding 2-4 grams of fish oil (EPA+ DHA) may also help lower triglycerides. Speak with your caregiver before adding any supplements to your regimen.  Following the Diet  Maintain your ideal weight. Your caregivers can help you with a diet. Generally,   eating less food and getting more exercise will help you lose weight. Joining a weight control group may also help. Ask your caregivers for a good weight control group in your area.  Eat low-fat foods instead of high-fat foods. This can help you lose weight too.  These foods are lower in fat. Eat MORE  of these:   Dried beans, peas, and lentils.   Egg whites.   Low-fat cottage cheese.   Fish.   Lean cuts of meat, such as round, sirloin, rump, and flank (cut extra fat off meat you fix).   Whole grain breads, cereals and pasta.   Skim and nonfat dry milk.   Low-fat yogurt.   Poultry without the skin.   Cheese made with skim or part-skim milk, such as mozzarella, parmesan, farmers', ricotta, or pot cheese.  These are higher fat foods. Eat LESS of these:   Whole milk and foods made from whole milk, such as American, blue, cheddar, monterey jack, and swiss cheese   High-fat meats, such as luncheon meats, sausages, knockwurst, bratwurst, hot dogs, ribs, corned beef, ground pork, and regular ground beef.   Fried foods.  Limit saturated fats in your diet. Substituting unsaturated fat for saturated fat may decrease your blood triglyceride level. You will need to read package labels to know which products contain saturated fats.  These foods are high in saturated fat. Eat LESS of these:   Fried pork skins.   Whole milk.   Skin and fat from poultry.   Palm oil.   Butter.   Shortening.   Cream cheese.   Bacon.   Margarines and baked goods made from listed oils.   Vegetable shortenings.   Chitterlings.   Fat from meats.   Coconut oil.   Palm kernel oil.   Lard.   Cream.   Sour cream.   Fatback.   Coffee whiteners and non-dairy creamers made with these oils.   Cheese made from whole milk.  Use unsaturated fats (both polyunsaturated and monounsaturated) moderately. Remember, even though unsaturated fats are better than saturated fats; you still want a diet low in total fat.  These foods are high in unsaturated fat:   Canola oil.   Sunflower oil.   Mayonnaise.   Almonds.   Peanuts.   Pine nuts.   Margarines made with these oils.   Safflower oil.   Olive oil.   Avocados.   Cashews.   Peanut butter.   Sunflower seeds.   Soybean oil.     Peanut oil.   Olives.   Pecans.   Walnuts.   Pumpkin seeds.  Avoid sugar and other high-sugar foods. This will decrease carbohydrates without decreasing other nutrients. Sugar in your food goes rapidly to your blood. When there is excess sugar in your blood, your liver may use it to make more triglycerides. Sugar also contains calories without other important nutrients.  Eat LESS of these:   Sugar, brown sugar, powdered sugar, jam, jelly, preserves, honey, syrup, molasses, pies, candy, cakes, cookies, frosting, pastries, colas, soft drinks, punches, fruit drinks, and regular gelatin.   Avoid alcohol. Alcohol, even more than sugar, may increase blood triglycerides. In addition, alcohol is high in calories and low in nutrients. Ask for sparkling water, or a diet soft drink instead of an alcoholic beverage.  Suggestions for planning and preparing meals   Bake, broil, grill or roast meats instead of frying.   Remove fat from meats and skin from poultry before cooking.   Add spices,   herbs, lemon juice or vinegar to vegetables instead of salt, rich sauces or gravies.   Use a non-stick skillet without fat or use no-stick sprays.   Cool and refrigerate stews and broth. Then remove the hardened fat floating on the surface before serving.   Refrigerate meat drippings and skim off fat to make low-fat gravies.   Serve more fish.   Use less butter, margarine and other high-fat spreads on bread or vegetables.   Use skim or reconstituted non-fat dry milk for cooking.   Cook with low-fat cheeses.   Substitute low-fat yogurt or cottage cheese for all or part of the sour cream in recipes for sauces, dips or congealed salads.   Use half yogurt/half mayonnaise in salad recipes.   Substitute evaporated skim milk for cream. Evaporated skim milk or reconstituted non-fat dry milk can be whipped and substituted for whipped cream in certain recipes.   Choose fresh fruits for dessert instead of  high-fat foods such as pies or cakes. Fruits are naturally low in fat.  When Dining Out   Order low-fat appetizers such as fruit or vegetable juice, pasta with vegetables or tomato sauce.   Select clear, rather than cream soups.   Ask that dressings and gravies be served on the side. Then use less of them.   Order foods that are baked, broiled, poached, steamed, stir-fried, or roasted.   Ask for margarine instead of butter, and use only a small amount.   Drink sparkling water, unsweetened tea or coffee, or diet soft drinks instead of alcohol or other sweet beverages.  QUESTIONS AND ANSWERS ABOUT OTHER FATS IN THE BLOOD: SATURATED FAT, TRANS FAT, AND CHOLESTEROL What is trans fat? Trans fat is a type of fat that is formed when vegetable oil is hardened through a process called hydrogenation. This process helps makes foods more solid, gives them shape, and prolongs their shelf life. Trans fats are also called hydrogenated or partially hydrogenated oils.  What do saturated fat, trans fat, and cholesterol in foods have to do with heart disease? Saturated fat, trans fat, and cholesterol in the diet all raise the level of LDL "bad" cholesterol in the blood. The higher the LDL cholesterol, the greater the risk for coronary heart disease (CHD). Saturated fat and trans fat raise LDL similarly.  What foods contain saturated fat, trans fat, and cholesterol? High amounts of saturated fat are found in animal products, such as fatty cuts of meat, chicken skin, and full-fat dairy products like butter, whole milk, cream, and cheese, and in tropical vegetable oils such as palm, palm kernel, and coconut oil. Trans fat is found in some of the same foods as saturated fat, such as vegetable shortening, some margarines (especially hard or stick margarine), crackers, cookies, baked goods, fried foods, salad dressings, and other processed foods made with partially hydrogenated vegetable oils. Small amounts of trans fat  also occur naturally in some animal products, such as milk products, beef, and lamb. Foods high in cholesterol include liver, other organ meats, egg yolks, shrimp, and full-fat dairy products. How can I use the new food label to make heart-healthy food choices? Check the Nutrition Facts panel of the food label. Choose foods lower in saturated fat, trans fat, and cholesterol. For saturated fat and cholesterol, you can also use the Percent Daily Value (%DV): 5% DV or less is low, and 20% DV or more is high. (There is no %DV for trans fat.) Use the Nutrition Facts panel to choose foods low in   saturated fat and cholesterol, and if the trans fat is not listed, read the ingredients and limit products that list shortening or hydrogenated or partially hydrogenated vegetable oil, which tend to be high in trans fat. POINTS TO REMEMBER: YOU NEED A LITTLE TLC (THERAPEUTIC LIFESTYLE CHANGES)  Discuss your risk for heart disease with your caregivers, and take steps to reduce risk factors.   Change your diet. Choose foods that are low in saturated fat, trans fat, and cholesterol.   Add exercise to your daily routine if it is not already being done. Participate in physical activity of moderate intensity, like brisk walking, for at least 30 minutes on most, and preferably all days of the week. No time? Break the 30 minutes into three, 10-minute segments during the day.   Stop smoking. If you do smoke, contact your caregiver to discuss ways in which they can help you quit.   Do not use street drugs.   Maintain a normal weight.   Maintain a healthy blood pressure.   Keep up with your blood work for checking the fats in your blood as directed by your caregiver.  Document Released: 09/16/2004 Document Revised: 11/18/2011 Document Reviewed: 04/14/2009 [employer] Patient Information 2012 [employer]. 

## 2012-05-19 NOTE — Assessment & Plan Note (Signed)
He has done well with his lifestyle modifications, I will recheck her FLP today to see how much this has improved, he reiterated today that he does not want to be on a med yet

## 2012-05-19 NOTE — Progress Notes (Signed)
Subjective:    Patient ID: Louis Short, male    DOB: 1971/04/29, 41 y.o.   MRN: [id number]  Hyperlipidemia This is a chronic problem. The current episode started more than 1 year ago. The problem is uncontrolled. Recent lipid tests were reviewed and are variable. Exacerbating diseases include obesity. He has no history of chronic renal disease, diabetes, hypothyroidism, liver disease or nephrotic syndrome. Factors aggravating his hyperlipidemia include no known factors. Pertinent negatives include no chest pain, focal sensory loss, focal weakness, leg pain, myalgias or shortness of breath. Current antihyperlipidemic treatment includes diet change. The current treatment provides moderate improvement of lipids. There are no compliance problems.   Anxiety Presents for follow-up visit. Symptoms include nervous/anxious behavior and panic. Patient reports no chest pain, compulsions, confusion, decreased concentration, depressed mood, dizziness, dry mouth, excessive worry, feeling of choking, hyperventilation, impotence, insomnia, irritability, malaise, muscle tension, nausea, obsessions, palpitations, restlessness, shortness of breath or suicidal ideas. Symptoms occur occasionally. The severity of symptoms is mild. The quality of sleep is good. Nighttime awakenings: none.   Compliance with medications is 26-50%.      Review of Systems  Constitutional: Negative for fever, chills, diaphoresis, activity change, appetite change, irritability, fatigue and unexpected weight change.  HENT: Negative.   Eyes: Negative.   Respiratory: Negative for apnea, cough, choking, chest tightness, shortness of breath, wheezing and stridor.   Cardiovascular: Negative for chest pain, palpitations and leg swelling.  Gastrointestinal: Negative for nausea, vomiting, abdominal pain, diarrhea, constipation and anal bleeding.  Genitourinary: Negative.  Negative for impotence.  Musculoskeletal: Negative for myalgias, back pain,  joint swelling, arthralgias and gait problem.  Skin: Negative for color change, pallor, rash and wound.  Neurological: Negative for dizziness, tremors, focal weakness, seizures, syncope, facial asymmetry, speech difficulty, weakness, light-headedness, numbness and headaches.  Hematological: Negative for adenopathy. Does not bruise/bleed easily.  Psychiatric/Behavioral: Negative for suicidal ideas, confusion and decreased concentration. The patient is nervous/anxious. The patient does not have insomnia.        Objective:   Physical Exam  Vitals reviewed. Constitutional: He is oriented to person, place, and time. He appears well-developed and well-nourished. No distress.  HENT:  Head: Normocephalic and atraumatic.  Mouth/Throat: Oropharynx is clear and moist. No oropharyngeal exudate.  Eyes: Conjunctivae are normal. Right eye exhibits no discharge. Left eye exhibits no discharge. No scleral icterus.  Neck: Normal range of motion. Neck supple. No JVD present. No tracheal deviation present. No thyromegaly present.  Cardiovascular: Normal rate, regular rhythm, normal heart sounds and intact distal pulses.  Exam reveals no gallop and no friction rub.   No murmur heard. Pulmonary/Chest: Effort normal and breath sounds normal. No stridor. No respiratory distress. He has no wheezes. He has no rales. He exhibits no tenderness.  Abdominal: Soft. Bowel sounds are normal. He exhibits no distension and no mass. There is no tenderness. There is no rebound and no guarding.  Musculoskeletal: Normal range of motion. He exhibits no edema and no tenderness.  Lymphadenopathy:    He has no cervical adenopathy.  Neurological: He is oriented to person, place, and time.  Skin: Skin is warm and dry. No rash noted. He is not diaphoretic. No erythema. No pallor.  Psychiatric: He has a normal mood and affect. His behavior is normal. Judgment and thought content normal.     Lab Results  Component Value Date   WBC  6.8 12/21/2010   HGB 16.8 12/21/2010   HCT 46.8 12/21/2010   PLT 221.0 12/21/2010   GLUCOSE  92 12/21/2010   CHOL 226* 12/21/2010   TRIG 201.0* 12/21/2010   HDL 31.40* 12/21/2010   LDLDIRECT 169.9 12/21/2010   ALT 34 12/21/2010   AST 39* 12/21/2010   NA 141 12/21/2010   K 4.4 12/21/2010   CL 103 12/21/2010   CREATININE 0.9 12/21/2010   BUN 16 12/21/2010   CO2 29 12/21/2010   TSH 1.44 12/21/2010       Assessment & Plan:

## 2012-08-25 ENCOUNTER — Encounter: Payer: Self-pay | Admitting: Internal Medicine

## 2012-08-25 ENCOUNTER — Ambulatory Visit (INDEPENDENT_AMBULATORY_CARE_PROVIDER_SITE_OTHER): Payer: Managed Care, Other (non HMO) | Admitting: Internal Medicine

## 2012-08-25 VITALS — BP 130/72 | HR 72 | Temp 98.2°F | Resp 16 | Wt 269.0 lb

## 2012-08-25 DIAGNOSIS — M109 Gout, unspecified: Secondary | ICD-10-CM

## 2012-08-25 MED ORDER — FEBUXOSTAT 40 MG PO TABS: 40.0000 mg | ORAL_TABLET | Freq: Every day | ORAL | Status: DC

## 2012-08-25 MED ORDER — METHYLPREDNISOLONE ACETATE 80 MG/ML IJ SUSP
120.0000 mg | Freq: Once | INTRAMUSCULAR | Status: AC
  Administered 2012-08-25: 120 mg via INTRAMUSCULAR

## 2012-08-25 MED ORDER — OXYCODONE-ACETAMINOPHEN 7.5-325 MG PO TABS: 1.0000 | ORAL_TABLET | ORAL | Status: AC | PRN

## 2012-08-25 MED ORDER — COLCHICINE 0.6 MG PO TABS: 0.6000 mg | ORAL_TABLET | Freq: Every day | ORAL | Status: DC

## 2012-08-25 NOTE — Patient Instructions (Signed)
Gout Gout is an inflammatory condition (arthritis) caused by a buildup of uric acid crystals in the joints. Uric acid is a chemical that is normally present in the blood. Under some circumstances, uric acid can form into crystals in your joints. This causes joint redness, soreness, and swelling (inflammation). Repeat attacks are common. Over time, uric acid crystals can form into masses (tophi) near a joint, causing disfigurement. Gout is treatable and often preventable. CAUSES  The disease begins with elevated levels of uric acid in the blood. Uric acid is produced by your body when it breaks down a naturally found substance called purines. This also happens when you eat certain foods such as meats and fish. Causes of an elevated uric acid level include:  Being passed down from parent to child (heredity).   Diseases that cause increased uric acid production (obesity, psoriasis, some cancers).   Excessive alcohol use.   Diet, especially diets rich in meat and seafood.   Medicines, including certain cancer-fighting drugs (chemotherapy), diuretics, and aspirin.   Chronic kidney disease. The kidneys are no longer able to remove uric acid well.   Problems with metabolism.  Conditions strongly associated with gout include:  Obesity.   High blood pressure.   High cholesterol.   Diabetes.  Not everyone with elevated uric acid levels gets gout. It is not understood why some people get gout and others do not. Surgery, joint injury, and eating too much of certain foods are some of the factors that can lead to gout. SYMPTOMS   An attack of gout comes on quickly. It causes intense pain with redness, swelling, and warmth in a joint.   Fever can occur.   Often, only one joint is involved. Certain joints are more commonly involved:   Base of the big toe.   Knee.   Ankle.   Wrist.   Finger.  Without treatment, an attack usually goes away in a few days to weeks. Between attacks, you  usually will not have symptoms, which is different from many other forms of arthritis. DIAGNOSIS  Your caregiver will suspect gout based on your symptoms and exam. Removal of fluid from the joint (arthrocentesis) is done to check for uric acid crystals. Your caregiver will give you a medicine that numbs the area (local anesthetic) and use a needle to remove joint fluid for exam. Gout is confirmed when uric acid crystals are seen in joint fluid, using a special microscope. Sometimes, blood, urine, and X-ray tests are also used. TREATMENT  There are 2 phases to gout treatment: treating the sudden onset (acute) attack and preventing attacks (prophylaxis). Treatment of an Acute Attack  Medicines are used. These include anti-inflammatory medicines or steroid medicines.   An injection of steroid medicine into the affected joint is sometimes necessary.   The painful joint is rested. Movement can worsen the arthritis.   You may use warm or cold treatments on painful joints, depending which works best for you.   Discuss the use of coffee, vitamin C, or cherries with your caregiver. These may be helpful treatment options.  Treatment to Prevent Attacks After the acute attack subsides, your caregiver may advise prophylactic medicine. These medicines either help your kidneys eliminate uric acid from your body or decrease your uric acid production. You may need to stay on these medicines for a very long time. The early phase of treatment with prophylactic medicine can be associated with an increase in acute gout attacks. For this reason, during the first few months   of treatment, your caregiver may also advise you to take medicines usually used for acute gout treatment. Be sure you understand your caregiver's directions. You should also discuss dietary treatment with your caregiver. Certain foods such as meats and fish can increase uric acid levels. Other foods such as dairy can decrease levels. Your caregiver  can give you a list of foods to avoid. HOME CARE INSTRUCTIONS   Do not take aspirin to relieve pain. This raises uric acid levels.   Only take over-the-counter or prescription medicines for pain, discomfort, or fever as directed by your caregiver.   Rest the joint as much as possible. When in bed, keep sheets and blankets off painful areas.   Keep the affected joint raised (elevated).   Use crutches if the painful joint is in your leg.   Drink enough water and fluids to keep your urine clear or pale yellow. This helps your body get rid of uric acid. Do not drink alcoholic beverages. They slow the passage of uric acid.   Follow your caregiver's dietary instructions. Pay careful attention to the amount of protein you eat. Your daily diet should emphasize fruits, vegetables, whole grains, and fat-free or low-fat milk products.   Maintain a healthy body weight.  SEEK MEDICAL CARE IF:   You have an oral temperature above 102 F (38.9 C).   You develop diarrhea, vomiting, or any side effects from medicines.   You do not feel better in 24 hours, or you are getting worse.  SEEK IMMEDIATE MEDICAL CARE IF:   Your joint becomes suddenly more tender and you have:   Chills.   An oral temperature above 102 F (38.9 C), not controlled by medicine.  MAKE SURE YOU:   Understand these instructions.   Will watch your condition.   Will get help right away if you are not doing well or get worse.  Document Released: 11/26/2000 Document Revised: 11/18/2011 Document Reviewed: 03/09/2010 [employer] Patient Information 2012 [place], [place].Arthritis - Gout Gout is caused by uric acid crystals forming in the joint. The big toe, foot, ankle, and knee are the joints most often affected.Often uric acid levels in the blood are also elevated. Symptoms develop rapidly, usually over hours. A joint fluid exam may be needed to prove the diagnosis. Acute gout episodes may follow a minor injury, surgery, illness  or medication change. Gout occurs more often in men. It tends to be an inherited (passed down from a family member) condition. Your chance of having gout is increased if you take certain medications. These include water pills (diuretics), aspirin, niacin, and cyclosporine. Kidney disease and alcohol consumption may also increase your chances of getting gout. Months or years can pass between gout attacks.  Treatment includes ice, rest and raising the affected limb until the swelling and pain are better.Anti-inflammatory medicine usually brings about dramatic relief of pain, redness and swelling within 2 to 3 days. Other medications can also be effective. Increase your fluid intake. Do not drink alcohol or eat liver, sweetbreads, or sardines. Long-term gout management may require medicine to lower blood uric acid levels, or changing or stopping diuretics. Please see your caregiver if your condition is not better after 1 to 2 days of treatment.  SEEK IMMEDIATE MEDICAL CARE IF:  You have more serious symptoms such as a fever, skin rash, diarrhea, vomiting, or other joint pains. Document Released: 01/06/2005 Document Revised: 11/18/2011 Document Reviewed: 01/20/2009 [facility] Patient Information 2012 [place], [place].

## 2012-08-27 ENCOUNTER — Encounter: Payer: Self-pay | Admitting: Internal Medicine

## 2012-08-27 NOTE — Progress Notes (Signed)
  Subjective:    Patient ID: Louis Short, male    DOB: [birth year], 41 y.o.   MRN: [id number]  Arthritis Presents for follow-up visit. He complains of pain, joint swelling and joint warmth. He reports no stiffness. The symptoms have been worsening. Affected locations include the right foot. His pain is at a severity of 5/10. Associated symptoms include pain at night and pain while resting. Pertinent negatives include no diarrhea, dysuria, fatigue, fever, rash or weight loss. Compliance with total regimen is 0-25%. Compliance problems include psychosocial issues.  Compliance with medications is 0-25%.      Review of Systems  Constitutional: Negative.  Negative for fever, weight loss and fatigue.  HENT: Negative.   Eyes: Negative.   Cardiovascular: Negative.   Gastrointestinal: Negative.  Negative for diarrhea.  Genitourinary: Negative.  Negative for dysuria.  Musculoskeletal: Positive for joint swelling, arthralgias and arthritis. Negative for myalgias, back pain, gait problem and stiffness.  Skin: Negative.  Negative for rash.  Neurological: Negative.   Hematological: Negative.   Psychiatric/Behavioral: Negative.        Objective:   Physical Exam  Vitals reviewed. Constitutional: He is oriented to person, place, and time. He appears well-developed and well-nourished. No distress.  HENT:  Head: Normocephalic and atraumatic.  Mouth/Throat: Oropharynx is clear and moist. No oropharyngeal exudate.  Eyes: Conjunctivae normal are normal. Right eye exhibits no discharge. Left eye exhibits no discharge. No scleral icterus.  Neck: Normal range of motion. Neck supple. No JVD present. No tracheal deviation present. No thyromegaly present.  Cardiovascular: Normal rate, regular rhythm, normal heart sounds and intact distal pulses.  Exam reveals no gallop and no friction rub.   No murmur heard. Pulmonary/Chest: Effort normal and breath sounds normal. No stridor. No respiratory distress. He has  no wheezes. He has no rales. He exhibits no tenderness.  Abdominal: Soft. Bowel sounds are normal. He exhibits no distension and no mass. There is no tenderness. There is no rebound and no guarding.  Musculoskeletal: Normal range of motion. He exhibits tenderness. He exhibits no edema.       Right foot: He exhibits tenderness, bony tenderness and swelling. He exhibits normal range of motion.       Feet:  Lymphadenopathy:    He has no cervical adenopathy.  Neurological: He is oriented to person, place, and time.  Skin: Skin is warm and dry. No rash noted. He is not diaphoretic. No erythema. No pallor.  Psychiatric: He has a normal mood and affect. His behavior is normal. Judgment and thought content normal.      Lab Results  Component Value Date   WBC 6.8 12/21/2010   HGB 16.8 12/21/2010   HCT 46.8 12/21/2010   PLT 221.0 12/21/2010   GLUCOSE 93 05/19/2012   CHOL 162 05/19/2012   TRIG 179.0* 05/19/2012   HDL 32.60* 05/19/2012   LDLDIRECT 169.9 12/21/2010   LDLCALC 94 05/19/2012   ALT 20 05/19/2012   AST 19 05/19/2012   NA 138 05/19/2012   K 3.9 05/19/2012   CL 105 05/19/2012   CREATININE 0.8 05/19/2012   BUN 18 05/19/2012   CO2 27 05/19/2012   TSH 0.80 05/19/2012      Assessment & Plan:

## 2012-08-27 NOTE — Assessment & Plan Note (Signed)
When this flare resolves he will start uloric to prevent future flares of gout

## 2012-08-27 NOTE — Assessment & Plan Note (Signed)
Treat with depo-medrol IM, colchicine, percocet for pain

## 2013-03-02 ENCOUNTER — Other Ambulatory Visit: Payer: Self-pay | Admitting: Internal Medicine

## 2013-03-29 ENCOUNTER — Ambulatory Visit (INDEPENDENT_AMBULATORY_CARE_PROVIDER_SITE_OTHER): Payer: BC Managed Care – PPO | Admitting: Internal Medicine

## 2013-03-29 ENCOUNTER — Encounter: Payer: Self-pay | Admitting: Internal Medicine

## 2013-03-29 ENCOUNTER — Other Ambulatory Visit (INDEPENDENT_AMBULATORY_CARE_PROVIDER_SITE_OTHER): Payer: BC Managed Care – PPO

## 2013-03-29 VITALS — BP 130/78 | HR 91 | Temp 97.3°F | Resp 16 | Ht 73.0 in | Wt 273.0 lb

## 2013-03-29 DIAGNOSIS — E782 Mixed hyperlipidemia: Secondary | ICD-10-CM

## 2013-03-29 DIAGNOSIS — M109 Gout, unspecified: Secondary | ICD-10-CM

## 2013-03-29 DIAGNOSIS — F410 Panic disorder [episodic paroxysmal anxiety] without agoraphobia: Secondary | ICD-10-CM

## 2013-03-29 LAB — LIPID PANEL
HDL: 26.3 mg/dL — ABNORMAL LOW (ref >39.00)
Total CHOL/HDL Ratio: 7
Triglycerides: 359 mg/dL — ABNORMAL HIGH (ref 0.0–149.0)
VLDL: 71.8 mg/dL — ABNORMAL HIGH (ref 0.0–40.0)

## 2013-03-29 LAB — URIC ACID: Uric Acid, Serum: 5.5 mg/dL (ref 4.0–7.8)

## 2013-03-29 LAB — COMPREHENSIVE METABOLIC PANEL
ALT: 26 U/L (ref 0–53)
AST: 30 U/L (ref 0–37)
Alkaline Phosphatase: 55 U/L (ref 39–117)
BUN: 17 mg/dL (ref 6–23)
Creatinine, Ser: 1.1 mg/dL (ref 0.4–1.5)
Total Bilirubin: 1.2 mg/dL (ref 0.3–1.2)

## 2013-03-29 LAB — LDL CHOLESTEROL, DIRECT: Direct LDL: 115.4 mg/dL

## 2013-03-29 MED ORDER — CLONAZEPAM 1 MG PO TABS: 1.0000 mg | ORAL_TABLET | Freq: Two times a day (BID) | ORAL | Status: DC | PRN

## 2013-03-29 NOTE — Assessment & Plan Note (Signed)
He is doing well on klonopin He is not willing to take an SSRI

## 2013-03-29 NOTE — Assessment & Plan Note (Signed)
He is doing well on uloric I will check his renal function and his uric acid level today

## 2013-03-29 NOTE — Assessment & Plan Note (Signed)
I will recheck his trig level and if it is nearing 500 will treat with meds He agrees to improve his lifestyle modifications

## 2013-03-29 NOTE — Progress Notes (Signed)
Subjective:    Patient ID: Louis Short, male    DOB: [birth year], 42 y.o.   MRN: [id number]  Hyperlipidemia This is a chronic problem. The problem is resistant. Recent lipid tests were reviewed and are variable. Exacerbating diseases include obesity. He has no history of chronic renal disease, diabetes, hypothyroidism, liver disease or nephrotic syndrome. Factors aggravating his hyperlipidemia include fatty foods. Pertinent negatives include no chest pain, focal sensory loss, focal weakness, leg pain, myalgias or shortness of breath. He is currently on no antihyperlipidemic treatment. The current treatment provides no improvement of lipids. Compliance problems include adherence to exercise and adherence to diet.       Review of Systems  Constitutional: Positive for unexpected weight change (some weight gain). Negative for fever, chills, diaphoresis, activity change, appetite change and fatigue.  HENT: Negative.   Eyes: Negative.   Respiratory: Negative.  Negative for shortness of breath.   Cardiovascular: Negative.  Negative for chest pain.  Gastrointestinal: Negative.  Negative for nausea, abdominal pain, diarrhea and constipation.  Endocrine: Negative.   Genitourinary: Negative.   Musculoskeletal: Negative.  Negative for myalgias, back pain, joint swelling, arthralgias and gait problem.  Skin: Negative.   Allergic/Immunologic: Negative.   Neurological: Negative.  Negative for dizziness, focal weakness, weakness and light-headedness.  Hematological: Negative.  Negative for adenopathy. Does not bruise/bleed easily.  Psychiatric/Behavioral: Negative for suicidal ideas, hallucinations, behavioral problems, confusion, sleep disturbance, self-injury, dysphoric mood, decreased concentration and agitation. The patient is nervous/anxious. The patient is not hyperactive.        Objective:   Physical Exam  Vitals reviewed. Constitutional: He is oriented to person, place, and time. He appears  well-developed and well-nourished. No distress.  HENT:  Head: Normocephalic and atraumatic.  Mouth/Throat: Oropharynx is clear and moist. No oropharyngeal exudate.  Eyes: Conjunctivae are normal. Right eye exhibits no discharge. Left eye exhibits no discharge. No scleral icterus.  Neck: Normal range of motion. Neck supple. No JVD present. No tracheal deviation present. No thyromegaly present.  Cardiovascular: Normal rate, regular rhythm, normal heart sounds and intact distal pulses.  Exam reveals no gallop and no friction rub.   No murmur heard. Pulmonary/Chest: Effort normal and breath sounds normal. No stridor. No respiratory distress. He has no wheezes. He has no rales. He exhibits no tenderness.  Abdominal: Soft. Bowel sounds are normal. He exhibits no distension and no mass. There is no tenderness. There is no rebound and no guarding.  Musculoskeletal: Normal range of motion. He exhibits no edema and no tenderness.  Lymphadenopathy:    He has no cervical adenopathy.  Neurological: He is oriented to person, place, and time.  Skin: Skin is warm and dry. No rash noted. He is not diaphoretic. No erythema. No pallor.  Psychiatric: He has a normal mood and affect. His behavior is normal. Judgment and thought content normal. His mood appears not anxious. His affect is not angry, not blunt, not labile and not inappropriate. His speech is not rapid and/or pressured, not delayed, not tangential and not slurred. Cognition and memory are normal. He does not exhibit a depressed mood. He is communicative.     Lab Results  Component Value Date   WBC 6.8 12/21/2010   HGB 16.8 12/21/2010   HCT 46.8 12/21/2010   PLT 221.0 12/21/2010   GLUCOSE 93 05/19/2012   CHOL 162 05/19/2012   TRIG 179.0* 05/19/2012   HDL 32.60* 05/19/2012   LDLDIRECT 169.9 12/21/2010   LDLCALC 94 05/19/2012   ALT 20 05/19/2012  AST 19 05/19/2012   NA 138 05/19/2012   K 3.9 05/19/2012   CL 105 05/19/2012   CREATININE 0.8 05/19/2012   BUN 18 05/19/2012    CO2 27 05/19/2012   TSH 0.80 05/19/2012       Assessment & Plan:

## 2013-03-29 NOTE — Patient Instructions (Signed)
Hypertriglyceridemia  Diet for High blood levels of Triglycerides Most fats in food are triglycerides. Triglycerides in your blood are stored as fat in your body. High levels of triglycerides in your blood may put you at a greater risk for heart disease and stroke.  Normal triglyceride levels are less than 150 mg/dL. Borderline high levels are 150-199 mg/dl. High levels are 200 - 499 mg/dL, and very high triglyceride levels are greater than 500 mg/dL. The decision to treat high triglycerides is generally based on the level. For people with borderline or high triglyceride levels, treatment includes weight loss and exercise. Drugs are recommended for people with very high triglyceride levels. Many people who need treatment for high triglyceride levels have metabolic syndrome. This syndrome is a collection of disorders that often include: insulin resistance, high blood pressure, blood clotting problems, high cholesterol and triglycerides. TESTING PROCEDURE FOR TRIGLYCERIDES  You should not eat 4 hours before getting your triglycerides measured. The normal range of triglycerides is between 10 and 250 milligrams per deciliter (mg/dl). Some people may have extreme levels (1000 or above), but your triglyceride level may be too high if it is above 150 mg/dl, depending on what other risk factors you have for heart disease.  People with high blood triglycerides may also have high blood cholesterol levels. If you have high blood cholesterol as well as high blood triglycerides, your risk for heart disease is probably greater than if you only had high triglycerides. High blood cholesterol is one of the main risk factors for heart disease. CHANGING YOUR DIET  Your weight can affect your blood triglyceride level. If you are more than 20% above your ideal body weight, you may be able to lower your blood triglycerides by losing weight. Eating less and exercising regularly is the best way to combat this. Fat provides more  calories than any other food. The best way to lose weight is to eat less fat. Only 30% of your total calories should come from fat. Less than 7% of your diet should come from saturated fat. A diet low in fat and saturated fat is the same as a diet to decrease blood cholesterol. By eating a diet lower in fat, you may lose weight, lower your blood cholesterol, and lower your blood triglyceride level.  Eating a diet low in fat, especially saturated fat, may also help you lower your blood triglyceride level. Ask your dietitian to help you figure how much fat you can eat based on the number of calories your caregiver has prescribed for you.  Exercise, in addition to helping with weight loss may also help lower triglyceride levels.   Alcohol can increase blood triglycerides. You may need to stop drinking alcoholic beverages.  Too much carbohydrate in your diet may also increase your blood triglycerides. Some complex carbohydrates are necessary in your diet. These may include bread, rice, potatoes, other starchy vegetables and cereals.  Reduce "simple" carbohydrates. These may include pure sugars, candy, honey, and jelly without losing other nutrients. If you have the kind of high blood triglycerides that is affected by the amount of carbohydrates in your diet, you will need to eat less sugar and less high-sugar foods. Your caregiver can help you with this.  Adding 2-4 grams of fish oil (EPA+ DHA) may also help lower triglycerides. Speak with your caregiver before adding any supplements to your regimen. Following the Diet  Maintain your ideal weight. Your caregivers can help you with a diet. Generally, eating less food and getting more   exercise will help you lose weight. Joining a weight control group may also help. Ask your caregivers for a good weight control group in your area.  Eat low-fat foods instead of high-fat foods. This can help you lose weight too.  These foods are lower in fat. Eat MORE of these:    Dried beans, peas, and lentils.  Egg whites.  Low-fat cottage cheese.  Fish.  Lean cuts of meat, such as round, sirloin, rump, and flank (cut extra fat off meat you fix).  Whole grain breads, cereals and pasta.  Skim and nonfat dry milk.  Low-fat yogurt.  Poultry without the skin.  Cheese made with skim or part-skim milk, such as mozzarella, parmesan, farmers', ricotta, or pot cheese. These are higher fat foods. Eat LESS of these:   Whole milk and foods made from whole milk, such as American, blue, cheddar, monterey jack, and swiss cheese  High-fat meats, such as luncheon meats, sausages, knockwurst, bratwurst, hot dogs, ribs, corned beef, ground pork, and regular ground beef.  Fried foods. Limit saturated fats in your diet. Substituting unsaturated fat for saturated fat may decrease your blood triglyceride level. You will need to read package labels to know which products contain saturated fats.  These foods are high in saturated fat. Eat LESS of these:   Fried pork skins.  Whole milk.  Skin and fat from poultry.  Palm oil.  Butter.  Shortening.  Cream cheese.  Bacon.  Margarines and baked goods made from listed oils.  Vegetable shortenings.  Chitterlings.  Fat from meats.  Coconut oil.  Palm kernel oil.  Lard.  Cream.  Sour cream.  Fatback.  Coffee whiteners and non-dairy creamers made with these oils.  Cheese made from whole milk. Use unsaturated fats (both polyunsaturated and monounsaturated) moderately. Remember, even though unsaturated fats are better than saturated fats; you still want a diet low in total fat.  These foods are high in unsaturated fat:   Canola oil.  Sunflower oil.  Mayonnaise.  Almonds.  Peanuts.  Pine nuts.  Margarines made with these oils.  Safflower oil.  Olive oil.  Avocados.  Cashews.  Peanut butter.  Sunflower seeds.  Soybean oil.  Peanut  oil.  Olives.  Pecans.  Walnuts.  Pumpkin seeds. Avoid sugar and other high-sugar foods. This will decrease carbohydrates without decreasing other nutrients. Sugar in your food goes rapidly to your blood. When there is excess sugar in your blood, your liver may use it to make more triglycerides. Sugar also contains calories without other important nutrients.  Eat LESS of these:   Sugar, brown sugar, powdered sugar, jam, jelly, preserves, honey, syrup, molasses, pies, candy, cakes, cookies, frosting, pastries, colas, soft drinks, punches, fruit drinks, and regular gelatin.  Avoid alcohol. Alcohol, even more than sugar, may increase blood triglycerides. In addition, alcohol is high in calories and low in nutrients. Ask for sparkling water, or a diet soft drink instead of an alcoholic beverage. Suggestions for planning and preparing meals   Bake, broil, grill or roast meats instead of frying.  Remove fat from meats and skin from poultry before cooking.  Add spices, herbs, lemon juice or vinegar to vegetables instead of salt, rich sauces or gravies.  Use a non-stick skillet without fat or use no-stick sprays.  Cool and refrigerate stews and broth. Then remove the hardened fat floating on the surface before serving.  Refrigerate meat drippings and skim off fat to make low-fat gravies.  Serve more fish.  Use less butter,   margarine and other high-fat spreads on bread or vegetables.  Use skim or reconstituted non-fat dry milk for cooking.  Cook with low-fat cheeses.  Substitute low-fat yogurt or cottage cheese for all or part of the sour cream in recipes for sauces, dips or congealed salads.  Use half yogurt/half mayonnaise in salad recipes.  Substitute evaporated skim milk for cream. Evaporated skim milk or reconstituted non-fat dry milk can be whipped and substituted for whipped cream in certain recipes.  Choose fresh fruits for dessert instead of high-fat foods such as pies or  cakes. Fruits are naturally low in fat. When Dining Out   Order low-fat appetizers such as fruit or vegetable juice, pasta with vegetables or tomato sauce.  Select clear, rather than cream soups.  Ask that dressings and gravies be served on the side. Then use less of them.  Order foods that are baked, broiled, poached, steamed, stir-fried, or roasted.  Ask for margarine instead of butter, and use only a small amount.  Drink sparkling water, unsweetened tea or coffee, or diet soft drinks instead of alcohol or other sweet beverages. QUESTIONS AND ANSWERS ABOUT OTHER FATS IN THE BLOOD: SATURATED FAT, TRANS FAT, AND CHOLESTEROL What is trans fat? Trans fat is a type of fat that is formed when vegetable oil is hardened through a process called hydrogenation. This process helps makes foods more solid, gives them shape, and prolongs their shelf life. Trans fats are also called hydrogenated or partially hydrogenated oils.  What do saturated fat, trans fat, and cholesterol in foods have to do with heart disease? Saturated fat, trans fat, and cholesterol in the diet all raise the level of LDL "bad" cholesterol in the blood. The higher the LDL cholesterol, the greater the risk for coronary heart disease (CHD). Saturated fat and trans fat raise LDL similarly.  What foods contain saturated fat, trans fat, and cholesterol? High amounts of saturated fat are found in animal products, such as fatty cuts of meat, chicken skin, and full-fat dairy products like butter, whole milk, cream, and cheese, and in tropical vegetable oils such as palm, palm kernel, and coconut oil. Trans fat is found in some of the same foods as saturated fat, such as vegetable shortening, some margarines (especially hard or stick margarine), crackers, cookies, baked goods, fried foods, salad dressings, and other processed foods made with partially hydrogenated vegetable oils. Small amounts of trans fat also occur naturally in some animal  products, such as milk products, beef, and lamb. Foods high in cholesterol include liver, other organ meats, egg yolks, shrimp, and full-fat dairy products. How can I use the new food label to make heart-healthy food choices? Check the Nutrition Facts panel of the food label. Choose foods lower in saturated fat, trans fat, and cholesterol. For saturated fat and cholesterol, you can also use the Percent Daily Value (%DV): 5% DV or less is low, and 20% DV or more is high. (There is no %DV for trans fat.) Use the Nutrition Facts panel to choose foods low in saturated fat and cholesterol, and if the trans fat is not listed, read the ingredients and limit products that list shortening or hydrogenated or partially hydrogenated vegetable oil, which tend to be high in trans fat. POINTS TO REMEMBER:   Discuss your risk for heart disease with your caregivers, and take steps to reduce risk factors.  Change your diet. Choose foods that are low in saturated fat, trans fat, and cholesterol.  Add exercise to your daily routine if   it is not already being done. Participate in physical activity of moderate intensity, like brisk walking, for at least 30 minutes on most, and preferably all days of the week. No time? Break the 30 minutes into three, 10-minute segments during the day.  Stop smoking. If you do smoke, contact your caregiver to discuss ways in which they can help you quit.  Do not use street drugs.  Maintain a normal weight.  Maintain a healthy blood pressure.  Keep up with your blood work for checking the fats in your blood as directed by your caregiver. Document Released: 09/16/2004 Document Revised: 05/30/2012 Document Reviewed: 04/14/2009 [employer] Patient Information 2013 [employer].  

## 2013-04-09 ENCOUNTER — Telehealth: Payer: Self-pay | Admitting: *Deleted

## 2013-04-09 NOTE — Telephone Encounter (Signed)
Pt calling requesting Lab results from 03/29/13. I called him and informed him a letter was mailed and relayed Dr. Yetta Barre' advisement on that letter.

## 2013-08-06 ENCOUNTER — Other Ambulatory Visit: Payer: Self-pay | Admitting: Internal Medicine

## 2013-08-06 ENCOUNTER — Telehealth: Payer: Self-pay | Admitting: *Deleted

## 2013-08-06 DIAGNOSIS — M109 Gout, unspecified: Secondary | ICD-10-CM

## 2013-08-06 MED ORDER — ALLOPURINOL 300 MG PO TABS: 300.0000 mg | ORAL_TABLET | Freq: Every day | ORAL | Status: DC

## 2013-08-06 NOTE — Telephone Encounter (Signed)
Pt called requesting a Generic refill for Urloric.  Please advise

## 2013-10-08 ENCOUNTER — Other Ambulatory Visit: Payer: Self-pay | Admitting: Internal Medicine

## 2017-06-27 ENCOUNTER — Other Ambulatory Visit: Payer: Self-pay | Admitting: Internal Medicine

## 2017-06-27 DIAGNOSIS — R1011 Right upper quadrant pain: Secondary | ICD-10-CM

## 2017-06-28 ENCOUNTER — Ambulatory Visit
Admission: RE | Admit: 2017-06-28 | Discharge: 2017-06-28 | Disposition: A | Discharge status: Home / Self Care | Payer: 59 | Source: Ambulatory Visit | Attending: Internal Medicine | Admitting: Internal Medicine

## 2017-06-28 DIAGNOSIS — R1011 Right upper quadrant pain: Secondary | ICD-10-CM

## 2018-05-02 IMAGING — US US ABDOMEN LIMITED
1 series · 14 of 25 positions shown · non-contrast
Comparison: None.

CLINICAL DATA: Right upper quadrant abdominal pain for the past
week.

EXAM:
ULTRASOUND ABDOMEN LIMITED RIGHT UPPER QUADRANT

[Series 1: us abdomen limited · 0.18mm/px · 14 of 45 slices shown]
[im 1/45]
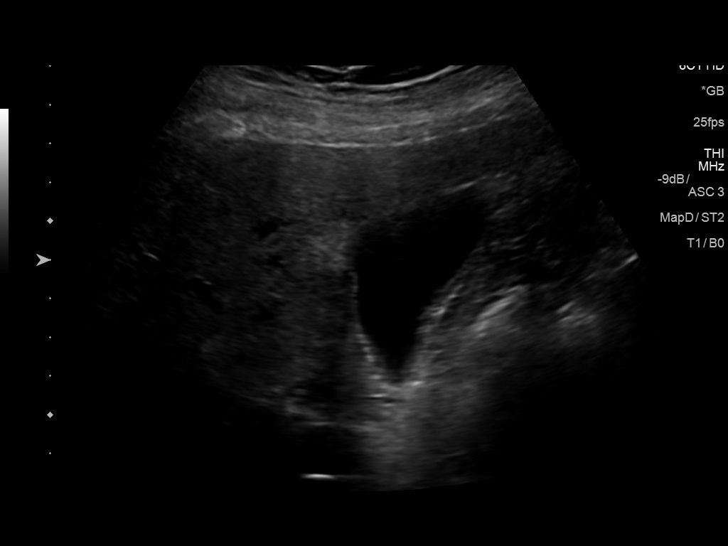
[im 4/45]
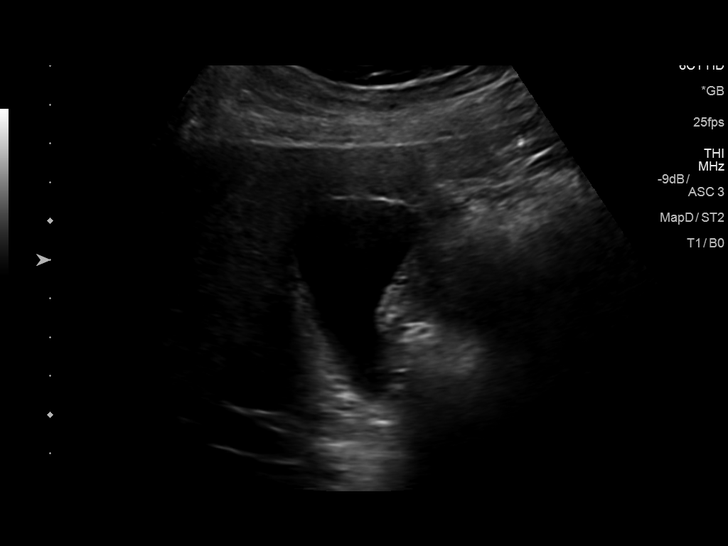
[im 8/45]
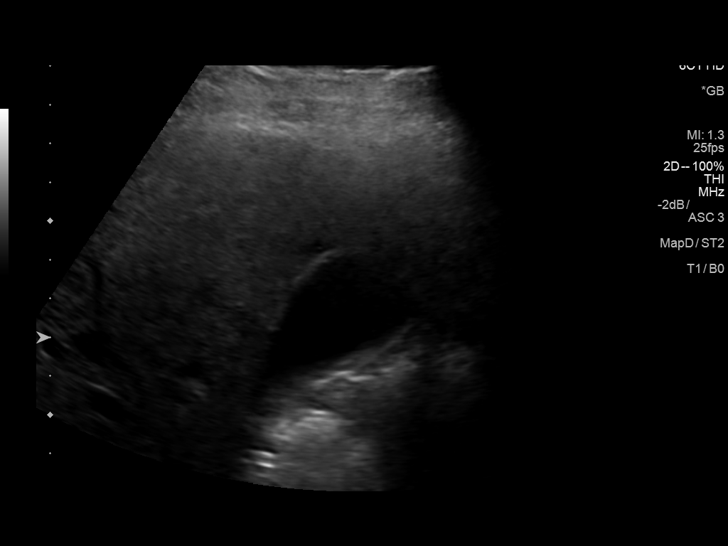
[im 12/45]
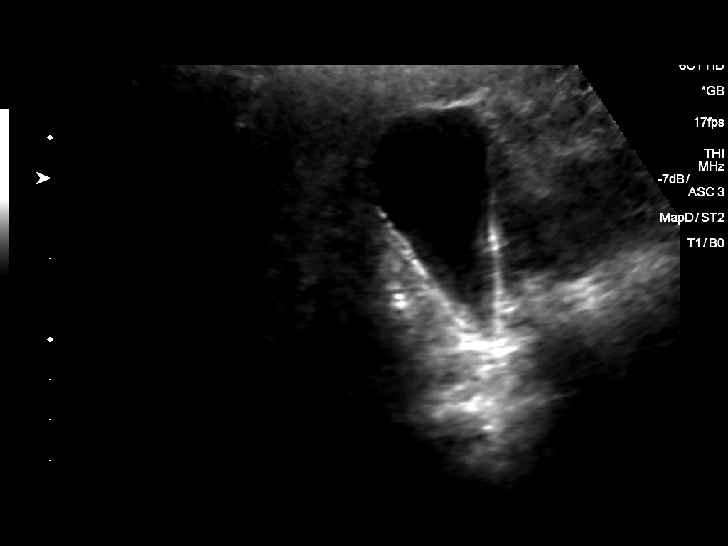
[im 15/45]
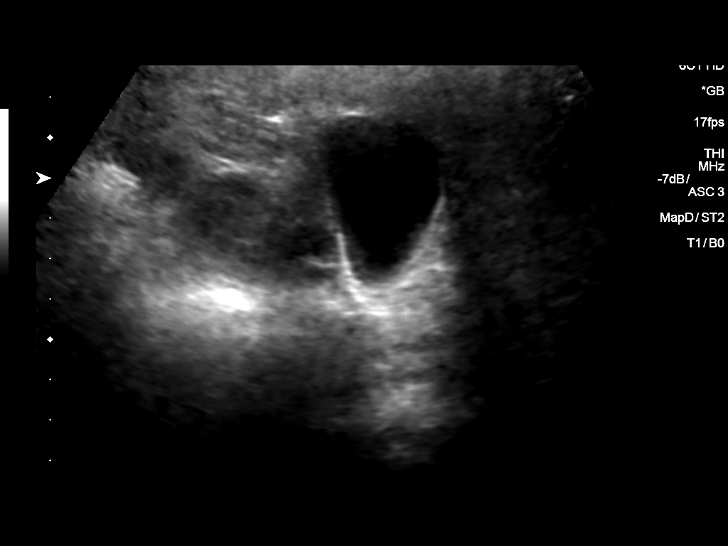
[im 17/45]
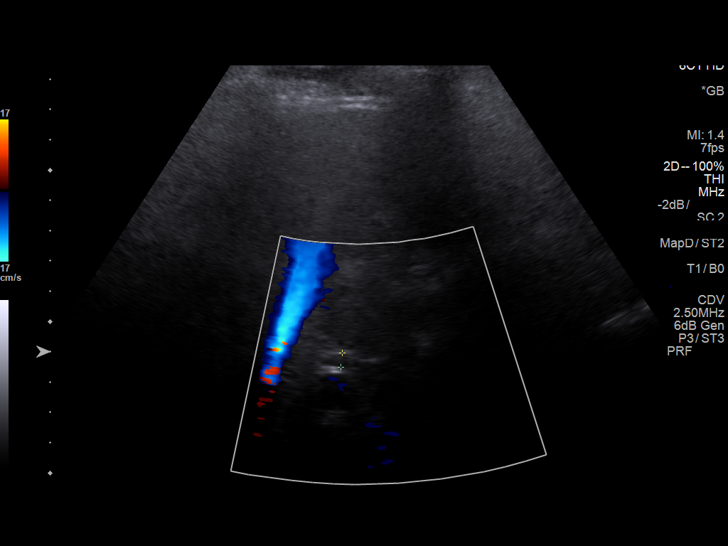
[im 21/45]
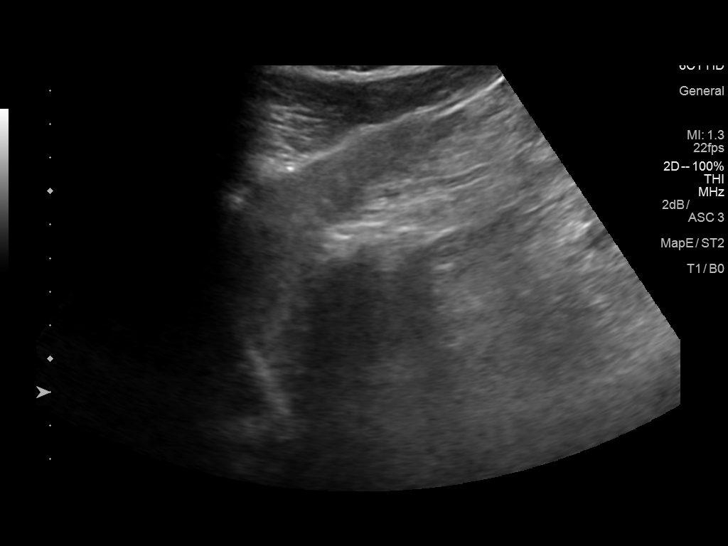
[im 24/45]
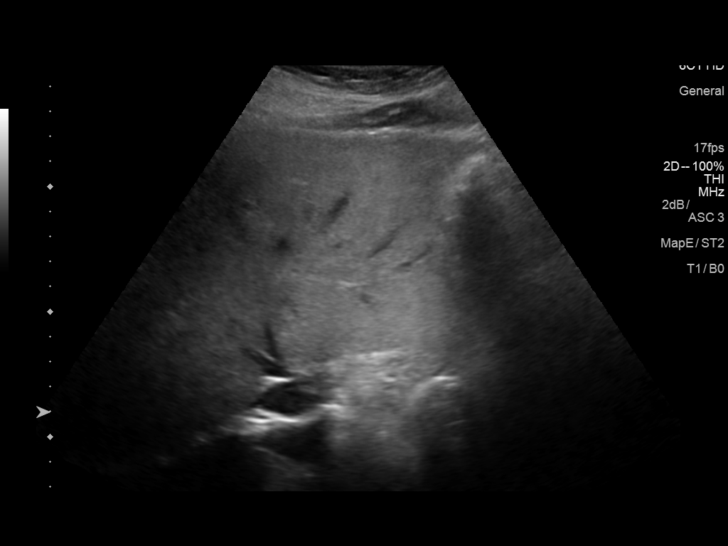
[im 28/45]
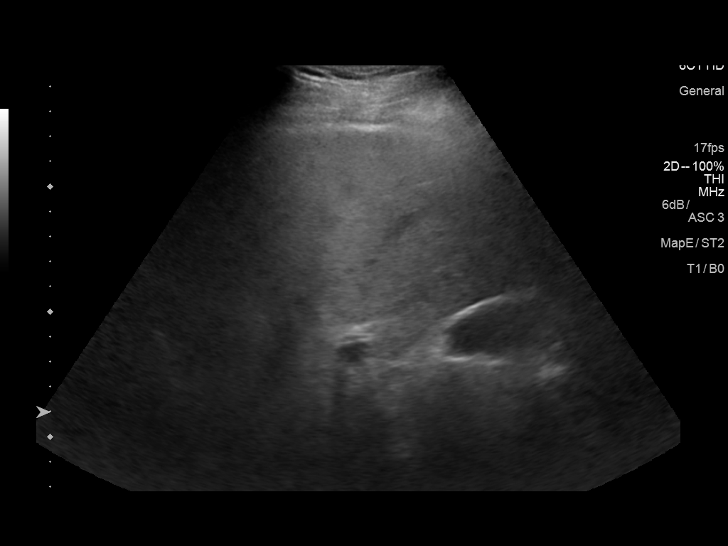
[im 30/45]
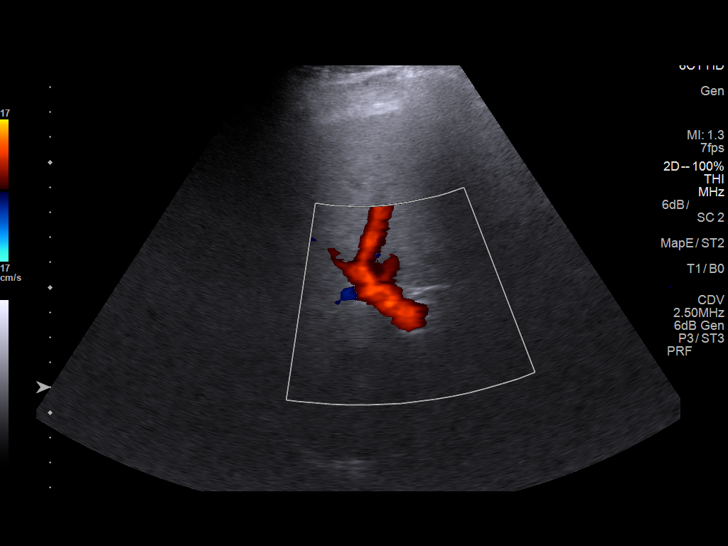
[im 34/45]
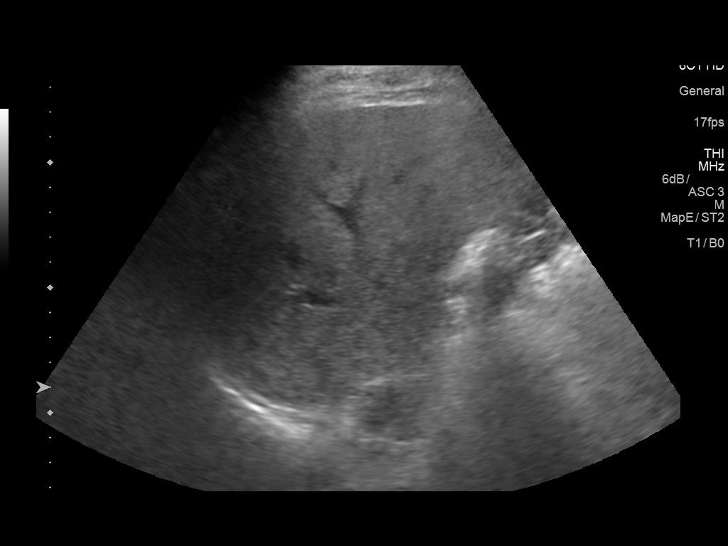
[im 37/45]
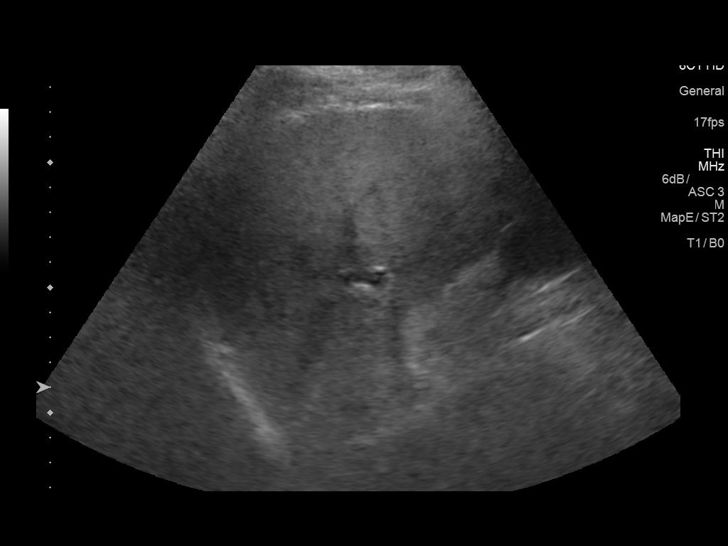
[im 41/45]
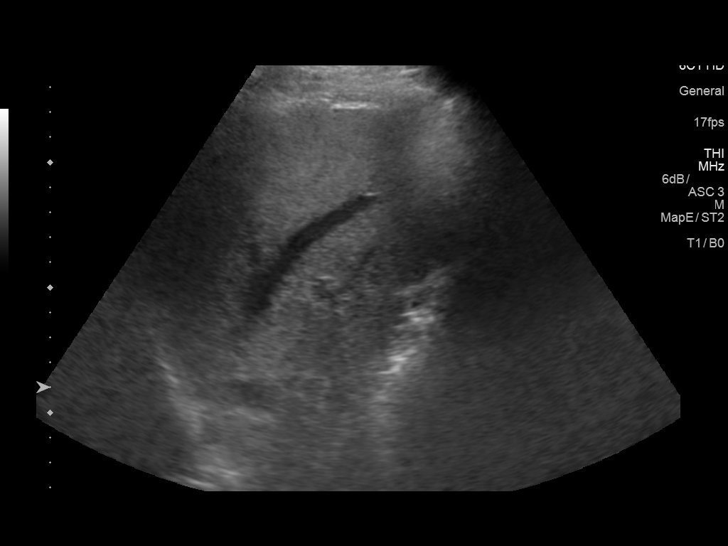
[im 45/45]
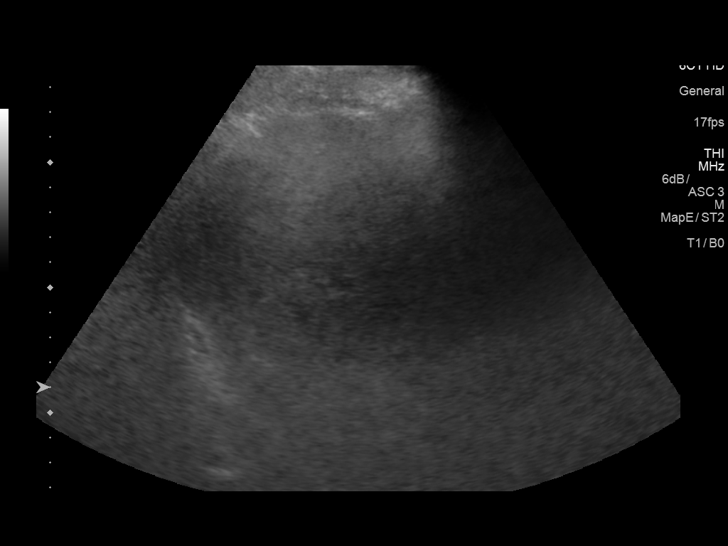

[14 of 25 positions shown; findings below may reference images not displayed]

FINDINGS: Gallbladder:

No gallstones or wall thickening visualized. No sonographic Murphy
sign noted by sonographer.

Common bile duct:

Diameter: 4.7 mm

Liver:

Diffusely echogenic.  Hepatopetal portal vein flow.
IMPRESSION: Diffusely echogenic liver, most likely due to steatosis. Chronic
hepatitis and cirrhosis can also produce this appearance. Otherwise,
normal examination.

## 2022-10-13 ENCOUNTER — Ambulatory Visit (INDEPENDENT_AMBULATORY_CARE_PROVIDER_SITE_OTHER): Payer: No Typology Code available for payment source

## 2022-10-13 ENCOUNTER — Encounter (HOSPITAL_COMMUNITY): Payer: Self-pay

## 2022-10-13 ENCOUNTER — Ambulatory Visit (HOSPITAL_COMMUNITY)
Admission: EM | Admit: 2022-10-13 | Discharge: 2022-10-13 | Disposition: A | Discharge status: Home / Self Care | Payer: No Typology Code available for payment source

## 2022-10-13 DIAGNOSIS — R0602 Shortness of breath: Secondary | ICD-10-CM

## 2022-10-13 MED ORDER — FUROSEMIDE 20 MG PO TABS: 20.0000 mg | ORAL_TABLET | Freq: Every day | ORAL | 0 refills | Status: DC

## 2022-10-13 MED ORDER — AMOXICILLIN 500 MG PO CAPS: 1000.0000 mg | ORAL_CAPSULE | Freq: Three times a day (TID) | ORAL | 0 refills | Status: DC

## 2022-10-13 MED ORDER — TRAMADOL HCL 50 MG PO TABS: 50.0000 mg | ORAL_TABLET | Freq: Four times a day (QID) | ORAL | 0 refills | Status: AC | PRN

## 2022-10-13 MED ORDER — AZITHROMYCIN 250 MG PO TABS: 250.0000 mg | ORAL_TABLET | Freq: Every day | ORAL | 0 refills | Status: DC

## 2022-10-13 NOTE — ED Provider Notes (Signed)
[facility]   [id number] 10/13/22 Arrival Time: 0813  ASSESSMENT & PLAN:  1. Shortness of breath    -Exact etiology of the patient's symptoms is difficult to ascertain.  He is short of breath but not in any acute distress and oxygenating well.  His chest x-ray showed left basilar airspace disease and small pleural effusion, which could be pneumonia or atelectasis.  There is no evidence of rib fracture.  He could have developed a pneumonia which subsequently led to pleural effusion or this could be cardiac in primary etiology.  Does have history of hypertension and snoring, which could be undiagnosed sleep apnea.  We will treat empirically for both causes,  with amoxicillin and azithromycin for possible pneumonia and 5 days of Lasix 20 mg for diuresis.  Also prescribed 3 days of tramadol for acute pain, PDMP was reviewed by myself during this encounter.  I recommend he follow-up with his PCP for further cardiac evaluation.  All questions were answered and he agrees to plan.  Meds ordered this encounter  Medications   furosemide (LASIX) 20 MG tablet    Sig: Take 1 tablet (20 mg total) by mouth daily for 5 days.    Dispense:  5 tablet    Refill:  0   traMADol (ULTRAM) 50 MG tablet    Sig: Take 1 tablet (50 mg total) by mouth every 6 (six) hours as needed for up to 3 days for severe pain.    Dispense:  12 tablet    Refill:  0   amoxicillin (AMOXIL) 500 MG capsule    Sig: Take 2 capsules (1,000 mg total) by mouth 3 (three) times daily for 5 days.    Dispense:  30 capsule    Refill:  0   azithromycin (ZITHROMAX) 250 MG tablet    Sig: Take 1 tablet (250 mg total) by mouth daily. Take first 2 tablets together, then 1 every day until finished.    Dispense:  6 tablet    Refill:  0     Discharge Instructions      You have a pleural effusion in your left lung This could be caused by pneumonia or your heart not pumping properly Lets treat both potential causes - antibiotics for  pneumonia (amoxicillin + azithromycin) and a diuretic to remove excess fluid from your body (Lasix). I have also called in a prescription for Tramadol to your pharmacy for acute pain. I recommend you see your primary doctor to discuss further heart studies. It was nice to meet you today!      Follow-up Information     Alysia Penna, MD.   Specialty: Internal Medicine Why: If symptoms worsen Contact information: [address] [place] [address] [phone]                  Reviewed expectations re: course of current medical issues. Questions answered. Outlined signs and symptoms indicating need for more acute intervention. Patient verbalized understanding. After Visit Summary given.   SUBJECTIVE: Pleasant 51 year old male with past medical history of hypertension comes to urgent care to be evaluated for left chest/flank pain and shortness of breath.  He comes in with his wife today.  He says he pulled a muscle in his left flank picking up a case of water about a month ago.  That got better on its own.  About a week ago he pulled the same muscles again.  He has now had really uncomfortable left-sided rib pain, that has gotten worse  over the last 2 days.  Pain is sharp and made worse with deep inspiration.  He also reports he has had several coughing spells.  Denies any fever or middle chest pain.  No complaint on the right side of the chest.  Denies any inciting injuries or trauma.  He has hypertension and hyperlipidemia but no other cardiovascular disease.  His wife does say he snores very loudly.  He has not had a sleep study to be evaluated for sleep apnea.  He does get short of breath with walking a long distance on a flat surface.  He is able to lay flat at night and does not have pillow orthopnea.  No LMP for male patient. Past Surgical History:  Procedure Laterality Date   VASECTOMY       OBJECTIVE:  Vitals:   10/13/22 0841  BP: (!) 129/91  Pulse: (!)  127  Resp: 16  Temp: 98 F (36.7 C)  TempSrc: Oral  SpO2: 94%     Physical Exam Vitals reviewed.  Constitutional:      Appearance: He is not ill-appearing.     Comments: Uncomfortable appearing but not toxic or in acute distress  HENT:     Head: Normocephalic.  Cardiovascular:     Rate and Rhythm: Tachycardia present.     Heart sounds: Normal heart sounds.  Pulmonary:     Effort: Accessory muscle usage present.     Breath sounds: Decreased air movement present. Examination of the left-middle field reveals decreased breath sounds. Examination of the left-lower field reveals decreased breath sounds. Decreased breath sounds present. No wheezing, rhonchi or rales.  Abdominal:     General: There is no distension.     Palpations: Abdomen is soft.     Tenderness: There is no abdominal tenderness. There is no right CVA tenderness, left CVA tenderness or guarding.  Musculoskeletal:        General: Normal range of motion.  Neurological:     General: No focal deficit present.     Mental Status: He is alert.      Labs: Results for orders placed or performed in visit on 03/29/13  Uric acid  Result Value Ref Range   Uric Acid, Serum 5.5 4.0 - 7.8 mg/dL  Comprehensive metabolic panel  Result Value Ref Range   Sodium 140 135 - 145 mEq/L   Potassium 4.3 3.5 - 5.1 mEq/L   Chloride 104 96 - 112 mEq/L   CO2 25 19 - 32 mEq/L   Glucose, Bld 86 70 - 99 mg/dL   BUN 17 6 - 23 mg/dL   Creatinine, Ser 1.1 0.4 - 1.5 mg/dL   Total Bilirubin 1.2 0.3 - 1.2 mg/dL   Alkaline Phosphatase 55 39 - 117 U/L   AST 30 0 - 37 U/L   ALT 26 0 - 53 U/L   Total Protein 7.5 6.0 - 8.3 g/dL   Albumin 4.1 3.5 - 5.2 g/dL   Calcium 9.1 8.4 - 10.5 mg/dL   GFR 81.84 >60.00 mL/min  Lipid panel  Result Value Ref Range   Cholesterol 189 0 - 200 mg/dL   Triglycerides 359.0 (H) 0.0 - 149.0 mg/dL   HDL 26.30 (L) >39.00 mg/dL   VLDL 71.8 (H) 0.0 - 40.0 mg/dL   Total CHOL/HDL Ratio 7   LDL cholesterol, direct   Result Value Ref Range   Direct LDL 115.4 mg/dL   Labs Reviewed - No data to display  Imaging: DG Ribs Unilateral W/Chest Left  Result  Date: 10/13/2022 CLINICAL DATA:  Shortness of breath EXAM: LEFT RIBS AND CHEST - 3+ VIEW COMPARISON:  Radiograph 02/04/2011 FINDINGS: Mild enlarged cardiac silhouette. Left basilar airspace disease and small left pleural effusion. Right lung is clear. No evidence of pneumothorax. No acute osseous abnormality. Specifically, no evidence of displaced rib fracture. 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 IMPRESSION: Small left pleural effusion and adjacent basilar airspace disease, could be pneumonia or atelectasis. No evidence of displaced rib fracture. Electronically Signed   By: Caprice Renshaw M.D.   On: 10/13/2022 09:21     No Known Allergies                                             Past Medical History:  Diagnosis Date   Allergy    GERD (gastroesophageal reflux disease)     Social History   Socioeconomic History   Marital status: Married    Spouse name: Not on file   Number of children: Not on file   Years of education: Not on file   Highest education level: Not on file  Occupational History   Not on file  Tobacco Use   Smoking status: Former    Years: 11.00    Types: Cigarettes    Quit date: 12/14/1999    Years since quitting: 22.8   Smokeless tobacco: Never  Substance and Sexual Activity   Alcohol use: No    Comment: 12 drinks per week   Drug use: No   Sexual activity: Yes  Other Topics Concern   Not on file  Social History Narrative   Regular exercise-yes   Married with children   Social Determinants of Health   Financial Resource Strain: Not on file  Food Insecurity: Not on file  Transportation Needs: Not on file  Physical Activity: Not on file  Stress: Not on file  Social Connections: Not on file  Intimate Partner Violence: Not on file    Family  History  Problem Relation Age of Onset   Arthritis Other    Diabetes Other        1st degree relative   Allergies Brother       Lilburn Straw, Baldemar Friday, MD 10/13/22 1019

## 2022-10-13 NOTE — ED Triage Notes (Signed)
Patient states a month ago he pulled a muscle on the left flank. That went away. Last Wednesday pulled those same muscles again. Pain with coughing and deep breaths. Also having muscle spasms.   No falls or injuries. Was picking up a case of water.

## 2022-10-13 NOTE — Discharge Instructions (Signed)
You have a pleural effusion in your left lung This could be caused by pneumonia or your heart not pumping properly Lets treat both potential causes - antibiotics for pneumonia (amoxicillin + azithromycin) and a diuretic to remove excess fluid from your body (Lasix). I have also called in a prescription for Tramadol to your pharmacy for acute pain. I recommend you see your primary doctor to discuss further heart studies. It was nice to meet you today!

## 2022-10-18 ENCOUNTER — Other Ambulatory Visit: Payer: Self-pay

## 2022-10-18 ENCOUNTER — Other Ambulatory Visit (HOSPITAL_COMMUNITY): Payer: Self-pay | Admitting: Internal Medicine

## 2022-10-18 ENCOUNTER — Inpatient Hospital Stay (HOSPITAL_BASED_OUTPATIENT_CLINIC_OR_DEPARTMENT_OTHER)
Admission: EM | Admit: 2022-10-18 | Discharge: 2022-10-22 | DRG: 871 | Disposition: A | Discharge status: Home / Self Care | Payer: No Typology Code available for payment source | Source: Ambulatory Visit | Attending: Internal Medicine | Admitting: Internal Medicine

## 2022-10-18 ENCOUNTER — Inpatient Hospital Stay (HOSPITAL_BASED_OUTPATIENT_CLINIC_OR_DEPARTMENT_OTHER)
Admission: RE | Admit: 2022-10-18 | Discharge: 2022-10-18 | Disposition: A | Discharge status: Home / Self Care | Payer: No Typology Code available for payment source | Source: Ambulatory Visit | Attending: Internal Medicine | Admitting: Internal Medicine

## 2022-10-18 ENCOUNTER — Encounter (HOSPITAL_COMMUNITY): Payer: Self-pay

## 2022-10-18 DIAGNOSIS — R0981 Nasal congestion: Secondary | ICD-10-CM | POA: Diagnosis present

## 2022-10-18 DIAGNOSIS — Z1152 Encounter for screening for COVID-19: Secondary | ICD-10-CM

## 2022-10-18 DIAGNOSIS — J90 Pleural effusion, not elsewhere classified: Secondary | ICD-10-CM | POA: Diagnosis present

## 2022-10-18 DIAGNOSIS — R0602 Shortness of breath: Principal | ICD-10-CM

## 2022-10-18 DIAGNOSIS — E860 Dehydration: Secondary | ICD-10-CM | POA: Diagnosis present

## 2022-10-18 DIAGNOSIS — K219 Gastro-esophageal reflux disease without esophagitis: Secondary | ICD-10-CM | POA: Diagnosis present

## 2022-10-18 DIAGNOSIS — R0609 Other forms of dyspnea: Secondary | ICD-10-CM

## 2022-10-18 DIAGNOSIS — E878 Other disorders of electrolyte and fluid balance, not elsewhere classified: Secondary | ICD-10-CM | POA: Diagnosis present

## 2022-10-18 DIAGNOSIS — E669 Obesity, unspecified: Secondary | ICD-10-CM | POA: Diagnosis present

## 2022-10-18 DIAGNOSIS — J189 Pneumonia, unspecified organism: Secondary | ICD-10-CM | POA: Diagnosis present

## 2022-10-18 DIAGNOSIS — F410 Panic disorder [episodic paroxysmal anxiety] without agoraphobia: Secondary | ICD-10-CM | POA: Diagnosis present

## 2022-10-18 DIAGNOSIS — F1090 Alcohol use, unspecified, uncomplicated: Secondary | ICD-10-CM | POA: Diagnosis present

## 2022-10-18 DIAGNOSIS — I10 Essential (primary) hypertension: Secondary | ICD-10-CM | POA: Diagnosis present

## 2022-10-18 DIAGNOSIS — J918 Pleural effusion in other conditions classified elsewhere: Secondary | ICD-10-CM | POA: Diagnosis present

## 2022-10-18 DIAGNOSIS — A419 Sepsis, unspecified organism: Principal | ICD-10-CM | POA: Diagnosis present

## 2022-10-18 DIAGNOSIS — Z6835 Body mass index (BMI) 35.0-35.9, adult: Secondary | ICD-10-CM

## 2022-10-18 DIAGNOSIS — Z833 Family history of diabetes mellitus: Secondary | ICD-10-CM

## 2022-10-18 DIAGNOSIS — R0982 Postnasal drip: Secondary | ICD-10-CM | POA: Diagnosis present

## 2022-10-18 DIAGNOSIS — E871 Hypo-osmolality and hyponatremia: Secondary | ICD-10-CM | POA: Diagnosis present

## 2022-10-18 DIAGNOSIS — Z87891 Personal history of nicotine dependence: Secondary | ICD-10-CM

## 2022-10-18 DIAGNOSIS — Z79899 Other long term (current) drug therapy: Secondary | ICD-10-CM

## 2022-10-18 LAB — CBC WITH DIFFERENTIAL/PLATELET
Abs Immature Granulocytes: 0.31 10*3/uL — ABNORMAL HIGH (ref 0.00–0.07)
Basophils Absolute: 0.1 10*3/uL (ref 0.0–0.1)
Basophils Relative: 0 %
Eosinophils Absolute: 0 10*3/uL (ref 0.0–0.5)
Eosinophils Relative: 0 %
HCT: 41.7 % (ref 39.0–52.0)
Hemoglobin: 14.5 g/dL (ref 13.0–17.0)
Immature Granulocytes: 2 %
Lymphocytes Relative: 7 %
Lymphs Abs: 1 10*3/uL (ref 0.7–4.0)
MCH: 33.6 pg (ref 26.0–34.0)
MCHC: 34.8 g/dL (ref 30.0–36.0)
MCV: 96.5 fL (ref 80.0–100.0)
Monocytes Absolute: 1.5 10*3/uL — ABNORMAL HIGH (ref 0.1–1.0)
Monocytes Relative: 10 %
Neutro Abs: 12.1 10*3/uL — ABNORMAL HIGH (ref 1.7–7.7)
Neutrophils Relative %: 81 %
Platelets: 354 10*3/uL (ref 150–400)
RBC: 4.32 MIL/uL (ref 4.22–5.81)
RDW: 11.8 % (ref 11.5–15.5)
WBC: 14.9 10*3/uL — ABNORMAL HIGH (ref 4.0–10.5)
nRBC: 0 % (ref 0.0–0.2)

## 2022-10-18 LAB — LACTIC ACID, PLASMA
Lactic Acid, Venous: 1 mmol/L (ref 0.5–1.9)
Lactic Acid, Venous: 0.9 mmol/L (ref 0.5–1.9)

## 2022-10-18 LAB — COMPREHENSIVE METABOLIC PANEL
ALT: 14 U/L (ref 0–44)
AST: 21 U/L (ref 15–41)
Albumin: 3.6 g/dL (ref 3.5–5.0)
Alkaline Phosphatase: 67 U/L (ref 38–126)
Anion gap: 13 (ref 5–15)
BUN: 16 mg/dL (ref 6–20)
CO2: 26 mmol/L (ref 22–32)
Calcium: 9.7 mg/dL (ref 8.9–10.3)
Chloride: 94 mmol/L — ABNORMAL LOW (ref 98–111)
Creatinine, Ser: 0.89 mg/dL (ref 0.61–1.24)
GFR, Estimated: >60 mL/min (ref >60)
Glucose, Bld: 107 mg/dL — ABNORMAL HIGH (ref 70–99)
Potassium: 4.2 mmol/L (ref 3.5–5.1)
Sodium: 133 mmol/L — ABNORMAL LOW (ref 135–145)
Total Bilirubin: 0.9 mg/dL (ref 0.3–1.2)
Total Protein: 8.3 g/dL — ABNORMAL HIGH (ref 6.5–8.1)

## 2022-10-18 LAB — LIPASE, BLOOD: Lipase: 26 U/L (ref 11–51)

## 2022-10-18 MED ORDER — VANCOMYCIN HCL 1500 MG/300ML IV SOLN: 1500.0000 mg | Freq: Once | INTRAVENOUS | Status: DC

## 2022-10-18 MED ORDER — IOHEXOL 350 MG/ML SOLN
75.0000 mL | Freq: Once | INTRAVENOUS | Status: AC | PRN
  Administered 2022-10-18: 75 mL via INTRAVENOUS

## 2022-10-18 MED ORDER — SODIUM CHLORIDE 0.9 % IV SOLN
2.0000 g | Freq: Once | INTRAVENOUS | Status: AC
  Administered 2022-10-18: 2 g via INTRAVENOUS
  Filled 2022-10-18: qty 12.5

## 2022-10-18 MED ORDER — VANCOMYCIN HCL IN DEXTROSE 1-5 GM/200ML-% IV SOLN
1000.0000 mg | Freq: Once | INTRAVENOUS | Status: AC
  Administered 2022-10-18: 1000 mg via INTRAVENOUS
  Filled 2022-10-18: qty 200

## 2022-10-18 MED ORDER — OXYCODONE HCL 5 MG PO TABS
5.0000 mg | ORAL_TABLET | ORAL | Status: DC | PRN
  Administered 2022-10-18 – 2022-10-21 (×3): 5 mg via ORAL
  Filled 2022-10-18 (×3): qty 1

## 2022-10-18 MED ORDER — VANCOMYCIN HCL 500 MG IV SOLR
500.0000 mg | Freq: Once | INTRAVENOUS | Status: AC
  Administered 2022-10-18: 500 mg via INTRAVENOUS

## 2022-10-18 MED ORDER — ACETAMINOPHEN 500 MG PO TABS
1000.0000 mg | ORAL_TABLET | Freq: Once | ORAL | Status: AC
  Administered 2022-10-18: 1000 mg via ORAL
  Filled 2022-10-18: qty 2

## 2022-10-18 NOTE — ED Triage Notes (Signed)
Pt. Being treated for pneumonia, sent here by his MD for CT scan

## 2022-10-18 NOTE — ED Notes (Signed)
ED Provider at bedside. 

## 2022-10-18 NOTE — ED Provider Notes (Signed)
[facility] EMER[facility]ENCY DEPT Provider Note   CSN: [id number] Arrival date & time: 10/18/22  1932     History Chief Complaint  Patient presents with   Shortness of Breath    HPI Louis Short is a 51 y.o. male presenting for abnormal CT scan.  Patient was getting an outpatient CT a PE study done on facility when they noted significant left-sided pleural effusion with right-sided shift of the chest.  Patient was emergently checked into the emergency department for further care and management.  He endorses 2 weeks of fevers, chills, nausea, vomiting, intermittent shortness of breath.  Denies smoking.  He is otherwise ambulatory tolerating p.o. intake.  Symptoms were actually more mild today.  Has been on amoxicillin and azithromycin for 5 days without symptomatic improvement.   Patient's recorded medical, surgical, social, medication list and allergies were reviewed in the Snapshot window as part of the initial history.   Review of Systems   Review of Systems  Constitutional:  Positive for fever. Negative for chills.  HENT:  Negative for ear pain and sore throat.   Eyes:  Negative for pain and visual disturbance.  Respiratory:  Positive for cough and shortness of breath.   Cardiovascular:  Negative for chest pain and palpitations.  Gastrointestinal:  Negative for abdominal pain and vomiting.  Genitourinary:  Negative for dysuria and hematuria.  Musculoskeletal:  Negative for arthralgias and back pain.  Skin:  Negative for color change and rash.  Neurological:  Negative for seizures and syncope.  All other systems reviewed and are negative.   Physical Exam Updated Vital Signs BP 133/84 (BP Location: Left Arm)   Pulse (!) 120   Temp (!) 100.8 F (38.2 C)   Resp 18   SpO2 96%  Physical Exam Vitals and nursing note reviewed.  Constitutional:      General: He is not in acute distress.    Appearance: He is well-developed.  HENT:     Head: Normocephalic and  atraumatic.  Eyes:     Conjunctiva/sclera: Conjunctivae normal.  Cardiovascular:     Rate and Rhythm: Normal rate and regular rhythm.     Heart sounds: No murmur heard. Pulmonary:     Effort: Pulmonary effort is normal. No respiratory distress.     Breath sounds: Examination of the left-upper field reveals decreased breath sounds. Examination of the left-middle field reveals decreased breath sounds. Examination of the left-lower field reveals decreased breath sounds. Decreased breath sounds present.  Abdominal:     Palpations: Abdomen is soft.     Tenderness: There is no abdominal tenderness.  Musculoskeletal:        General: No swelling.     Cervical back: Neck supple.  Skin:    General: Skin is warm and dry.     Capillary Refill: Capillary refill takes less than 2 seconds.  Neurological:     Mental Status: He is alert.  Psychiatric:        Mood and Affect: Mood normal.      ED Course/ Medical Decision Making/ A&P    Procedures .Critical Care  Performed by: Tretha Sciara, MD Authorized by: Tretha Sciara, MD   Critical care provider statement:    Critical care time (minutes):  30   Critical care was necessary to treat or prevent imminent or life-threatening deterioration of the following conditions:  Respiratory failure and sepsis   Critical care was time spent personally by me on the following activities:  Development of treatment plan with patient or  surrogate, discussions with consultants, evaluation of patient's response to treatment, examination of patient, ordering and review of laboratory studies, ordering and review of radiographic studies, ordering and performing treatments and interventions, pulse oximetry, re-evaluation of patient's condition and review of old charts    Medications Ordered in ED Medications - No data to display  Medical Decision Making:    Mancil Pfenning is a 51 y.o. male who presented to the ED today with abnormal CT scan detailed  above.     Additional history discussed with patient's family/caregivers.  Patient placed on continuous vitals and telemetry monitoring while in ED which was reviewed periodically.   Complete initial physical exam performed, notably the patient  was hemodynamically stable in no acute distress.  Unfortunately he is febrile and tachycardic triggering sepsis protocol per hospital policy.      Reviewed and confirmed nursing documentation for past medical history, family history, social history.    Initial Assessment:   With the patient's presentation of fever, cough, shortness of breath with left-sided fluid collection, most likely diagnosis is pneumonia secondary to developing empyema. Other diagnoses were considered including (but not limited to) malignancy with transudate. These are considered less likely due to history of present illness and physical exam findings.   This is most consistent with an acute life/limb threatening illness complicated by underlying chronic conditions.  Initial Plan:  Screening labs including CBC and Metabolic panel to evaluate for infectious or metabolic etiology of disease.  Urinalysis with reflex culture ordered to evaluate for UTI or relevant urologic/nephrologic pathology.  Objective evaluation as below reviewed with plan for close reassessment.  Sepsis protocol given nonspecific source at this time, patient treated with vancomycin and cefepime, 2 L IV fluid.  Initial Study Results:   Laboratory  Leukocytosis   Radiology  All images reviewed independently. Agree with radiology report at this time.   CT Angio Chest Pulmonary Embolism (PE) W or WO Contrast  Result Date: 10/18/2022 CLINICAL DATA:  Pleural effusion. EXAM: CT ANGIOGRAPHY CHEST WITH CONTRAST TECHNIQUE: Multidetector CT imaging of the chest was performed using the standard protocol during bolus administration of intravenous contrast. Multiplanar CT image reconstructions and MIPs were obtained to  evaluate the vascular anatomy. RADIATION DOSE REDUCTION: This exam was performed according to the departmental dose-optimization program which includes automated exposure control, adjustment of the mA and/or kV according to patient size and/or use of iterative reconstruction technique. CONTRAST:  34mL OMNIPAQUE IOHEXOL 350 MG/ML SOLN COMPARISON:  Rib radiographs 10/13/2022 FINDINGS: Cardiovascular: There is no pulmonary embolus to the segmental level. The subsegmental branches are not well assessed due to contrast bolus timing. The left pulmonary arteries are further limited due to compression related to atelectasis. Heart is normal in size. There is slight rightward displacement of the heart and mediastinal structures due to large left pleural effusion. There are coronary artery calcifications. No pericardial effusion. The thoracic aorta is normal in caliber with mild atherosclerosis. No dissection. Mediastinum/Nodes: Scattered mediastinal lymph nodes, largest in the anterior paratracheal station measuring 9 mm. No enlarged lymph nodes by size criteria. Left hilar assessment is limited due to adjacent pleural fluid and atelectasis. There is no obvious bulky adenopathy. The esophagus is decompressed. Lungs/Pleura: Large left pleural effusion which has increased in size from prior exam. Pleural fluid measures simple fluid density. There is no pleural enhancement. Effusion is partially loculated. There is complete compressive atelectasis of the left lower lobe and subtotal compressive atelectasis of the left upper lobe. Allowing for motion artifact the right  lung is clear. There is no right pleural effusion. The trachea and central airways are patent. No pulmonary mass or nodule in the aerated lung, left lung assessment is limited due to compressive atelectasis. Upper Abdomen: No acute upper abdominal findings. Suspected hepatic steatosis which is not well assessed due to phase of contrast. Musculoskeletal: Faint  sclerotic density within the left lateral sixth rib which has nonaggressive features. No suspicious bone lesion or rib fracture. Review of the MIP images confirms the above findings. IMPRESSION: 1. No pulmonary embolus to the segmental level. The subsegmental branches are not well assessed due to contrast bolus timing. 2. Large left pleural effusion which has increased in size from prior exam. There is complete compressive atelectasis of the left lower lobe and subtotal compressive atelectasis of the left upper lobe. Effusion causes mild mass effect and rightward medial shift of the heart and mediastinal structures. 3. The right lung is clear. 4. Aortic atherosclerosis.  Coronary artery calcifications. 5. Suspected hepatic steatosis which is not well assessed due to phase of contrast. Aortic Atherosclerosis (ICD10-I70.0). These results were called by telephone at the time of interpretation on 10/18/2022 at 7:28 pm to provider Timothy Lasso , who verbally acknowledged these results. Given the size of the effusion that has increased, the patient was referred to the emergency room for further evaluation and treatment. This information was conveyed to the covering technologist to direct the patient to the ER. Electronically Signed   By: Narda Rutherford M.D.   On: 10/18/2022 19:29     Consults:  Case discussed with hospitalist Dr. Margo Aye.   Final Assessment and Plan:   Ultimately, patient's case is most consistent with likely developing empyema.  Consulted hospitalist who agreed with need for admission.  Patient fortunately stabilized with IV fluids, broad-spectrum antibiotics.  Patient monitored closely in the emergency department and arranged for transfer to [facility] for ongoing close management.   Disposition:   Based on the above findings, I believe this patient is stable for admission.    Patient/family educated about specific findings on our evaluation and explained exact reasons for admission.   Patient/family educated about clinical situation and time was allowed to answer questions.   Admission team communicated with and agreed with need for admission. Patient admitted. Patient ready to move at this time.     Emergency Department Medication Summary:   Medications  vancomycin (VANCOCIN) IVPB 1000 mg/200 mL premix (1,000 mg Intravenous New Bag/Given 10/18/22 2051)    Followed by  vancomycin (VANCOCIN) 500 mg in sodium chloride 0.9 % 100 mL IVPB (500 mg Intravenous New Bag/Given 10/18/22 2209)  acetaminophen (TYLENOL) tablet 1,000 mg (has no administration in time range)  oxyCODONE (Oxy IR/ROXICODONE) immediate release tablet 5 mg (has no administration in time range)  ceFEPIme (MAXIPIME) 2 g in sodium chloride 0.9 % 100 mL IVPB (2 g Intravenous New Bag/Given 10/18/22 2051)         Clinical Impression: No diagnosis found.   Data Unavailable   Final Clinical Impression(s) / ED Diagnoses Final diagnoses:  None    Rx / DC Orders ED Discharge Orders     None         Glyn Ade, MD 10/18/22 2303

## 2022-10-19 ENCOUNTER — Ambulatory Visit (HOSPITAL_COMMUNITY): Payer: No Typology Code available for payment source

## 2022-10-19 ENCOUNTER — Encounter (HOSPITAL_COMMUNITY): Payer: Self-pay | Admitting: Internal Medicine

## 2022-10-19 ENCOUNTER — Inpatient Hospital Stay (HOSPITAL_COMMUNITY): Payer: No Typology Code available for payment source

## 2022-10-19 DIAGNOSIS — Z1152 Encounter for screening for COVID-19: Secondary | ICD-10-CM | POA: Diagnosis not present

## 2022-10-19 DIAGNOSIS — F1090 Alcohol use, unspecified, uncomplicated: Secondary | ICD-10-CM | POA: Diagnosis present

## 2022-10-19 DIAGNOSIS — J90 Pleural effusion, not elsewhere classified: Secondary | ICD-10-CM | POA: Diagnosis not present

## 2022-10-19 DIAGNOSIS — A419 Sepsis, unspecified organism: Secondary | ICD-10-CM | POA: Diagnosis present

## 2022-10-19 DIAGNOSIS — E860 Dehydration: Secondary | ICD-10-CM | POA: Diagnosis present

## 2022-10-19 DIAGNOSIS — R0982 Postnasal drip: Secondary | ICD-10-CM | POA: Diagnosis present

## 2022-10-19 DIAGNOSIS — F410 Panic disorder [episodic paroxysmal anxiety] without agoraphobia: Secondary | ICD-10-CM | POA: Diagnosis present

## 2022-10-19 DIAGNOSIS — I10 Essential (primary) hypertension: Secondary | ICD-10-CM

## 2022-10-19 DIAGNOSIS — Z6835 Body mass index (BMI) 35.0-35.9, adult: Secondary | ICD-10-CM | POA: Diagnosis not present

## 2022-10-19 DIAGNOSIS — J189 Pneumonia, unspecified organism: Secondary | ICD-10-CM | POA: Diagnosis present

## 2022-10-19 DIAGNOSIS — E878 Other disorders of electrolyte and fluid balance, not elsewhere classified: Secondary | ICD-10-CM | POA: Diagnosis present

## 2022-10-19 DIAGNOSIS — K219 Gastro-esophageal reflux disease without esophagitis: Secondary | ICD-10-CM | POA: Diagnosis present

## 2022-10-19 DIAGNOSIS — R0602 Shortness of breath: Secondary | ICD-10-CM | POA: Diagnosis present

## 2022-10-19 DIAGNOSIS — Z833 Family history of diabetes mellitus: Secondary | ICD-10-CM | POA: Diagnosis not present

## 2022-10-19 DIAGNOSIS — E669 Obesity, unspecified: Secondary | ICD-10-CM

## 2022-10-19 DIAGNOSIS — R0981 Nasal congestion: Secondary | ICD-10-CM | POA: Diagnosis present

## 2022-10-19 DIAGNOSIS — E871 Hypo-osmolality and hyponatremia: Secondary | ICD-10-CM

## 2022-10-19 DIAGNOSIS — Z79899 Other long term (current) drug therapy: Secondary | ICD-10-CM | POA: Diagnosis not present

## 2022-10-19 DIAGNOSIS — Z87891 Personal history of nicotine dependence: Secondary | ICD-10-CM | POA: Diagnosis not present

## 2022-10-19 DIAGNOSIS — J918 Pleural effusion in other conditions classified elsewhere: Secondary | ICD-10-CM | POA: Diagnosis present

## 2022-10-19 HISTORY — PX: IR THORACENTESIS ASP PLEURAL SPACE W/IMG GUIDE: IMG5380

## 2022-10-19 LAB — CBC WITH DIFFERENTIAL/PLATELET
Abs Immature Granulocytes: 0.5 10*3/uL — ABNORMAL HIGH (ref 0.00–0.07)
Basophils Absolute: 0.1 10*3/uL (ref 0.0–0.1)
Basophils Relative: 0 %
Eosinophils Absolute: 0.1 10*3/uL (ref 0.0–0.5)
Eosinophils Relative: 1 %
HCT: 37.1 % — ABNORMAL LOW (ref 39.0–52.0)
Hemoglobin: 13.2 g/dL (ref 13.0–17.0)
Immature Granulocytes: 3 %
Lymphocytes Relative: 6 %
Lymphs Abs: 0.9 10*3/uL (ref 0.7–4.0)
MCH: 34.4 pg — ABNORMAL HIGH (ref 26.0–34.0)
MCHC: 35.6 g/dL (ref 30.0–36.0)
MCV: 96.6 fL (ref 80.0–100.0)
Monocytes Absolute: 1.5 10*3/uL — ABNORMAL HIGH (ref 0.1–1.0)
Monocytes Relative: 10 %
Neutro Abs: 12.7 10*3/uL — ABNORMAL HIGH (ref 1.7–7.7)
Neutrophils Relative %: 80 %
Platelets: 376 10*3/uL (ref 150–400)
RBC: 3.84 MIL/uL — ABNORMAL LOW (ref 4.22–5.81)
RDW: 11.8 % (ref 11.5–15.5)
WBC: 15.9 10*3/uL — ABNORMAL HIGH (ref 4.0–10.5)
nRBC: 0 % (ref 0.0–0.2)

## 2022-10-19 LAB — BASIC METABOLIC PANEL
Anion gap: 11 (ref 5–15)
BUN: 14 mg/dL (ref 6–20)
CO2: 27 mmol/L (ref 22–32)
Calcium: 9 mg/dL (ref 8.9–10.3)
Chloride: 96 mmol/L — ABNORMAL LOW (ref 98–111)
Creatinine, Ser: 0.88 mg/dL (ref 0.61–1.24)
GFR, Estimated: >60 mL/min (ref >60)
Glucose, Bld: 113 mg/dL — ABNORMAL HIGH (ref 70–99)
Potassium: 3.8 mmol/L (ref 3.5–5.1)
Sodium: 134 mmol/L — ABNORMAL LOW (ref 135–145)

## 2022-10-19 LAB — BODY FLUID CELL COUNT WITH DIFFERENTIAL
Eos, Fluid: 1 %
Lymphs, Fluid: 2 %
Monocyte-Macrophage-Serous Fluid: 7 % — ABNORMAL LOW (ref 50–90)
Neutrophil Count, Fluid: 90 % — ABNORMAL HIGH (ref 0–25)
Total Nucleated Cell Count, Fluid: 2200 cu mm — ABNORMAL HIGH (ref 0–1000)

## 2022-10-19 LAB — GRAM STAIN

## 2022-10-19 LAB — ALBUMIN, PLEURAL OR PERITONEAL FLUID: Albumin, Fluid: 2.1 g/dL

## 2022-10-19 LAB — RESP PANEL BY RT-PCR (FLU A&B, COVID) ARPGX2
Influenza A by PCR: NEGATIVE
Influenza B by PCR: NEGATIVE
SARS Coronavirus 2 by RT PCR: NEGATIVE

## 2022-10-19 LAB — AMYLASE, PLEURAL OR PERITONEAL FLUID: Amylase, Fluid: 27 U/L

## 2022-10-19 LAB — GLUCOSE, PLEURAL OR PERITONEAL FLUID: Glucose, Fluid: 29 mg/dL

## 2022-10-19 LAB — GLUCOSE, CAPILLARY: Glucose-Capillary: 137 mg/dL — ABNORMAL HIGH (ref 70–99)

## 2022-10-19 LAB — LACTATE DEHYDROGENASE, PLEURAL OR PERITONEAL FLUID: LD, Fluid: 756 U/L — ABNORMAL HIGH (ref 3–23)

## 2022-10-19 LAB — PROTEIN, PLEURAL OR PERITONEAL FLUID: Total protein, fluid: 4.9 g/dL

## 2022-10-19 LAB — HIV ANTIBODY (ROUTINE TESTING W REFLEX): HIV Screen 4th Generation wRfx: NONREACTIVE

## 2022-10-19 MED ORDER — CLONAZEPAM 0.5 MG PO TABS
1.0000 mg | ORAL_TABLET | Freq: Two times a day (BID) | ORAL | Status: DC | PRN
  Administered 2022-10-20 – 2022-10-21 (×2): 1 mg via ORAL
  Filled 2022-10-19 (×2): qty 2

## 2022-10-19 MED ORDER — LIDOCAINE HCL 1 % IJ SOLN
INTRAMUSCULAR | Status: AC
  Administered 2022-10-19: 8 mL
  Filled 2022-10-19: qty 20

## 2022-10-19 MED ORDER — LISINOPRIL 20 MG PO TABS
20.0000 mg | ORAL_TABLET | Freq: Every day | ORAL | Status: DC
  Administered 2022-10-19 – 2022-10-22 (×4): 20 mg via ORAL
  Filled 2022-10-19 (×4): qty 1

## 2022-10-19 MED ORDER — ONDANSETRON HCL 4 MG/2ML IJ SOLN: 4.0000 mg | Freq: Four times a day (QID) | INTRAMUSCULAR | Status: DC | PRN

## 2022-10-19 MED ORDER — HYDRALAZINE HCL 20 MG/ML IJ SOLN: 10.0000 mg | INTRAMUSCULAR | Status: DC | PRN

## 2022-10-19 MED ORDER — CALCIUM CARBONATE ANTACID 500 MG PO CHEW: 1.0000 | CHEWABLE_TABLET | Freq: Every day | ORAL | Status: DC | PRN

## 2022-10-19 MED ORDER — ONDANSETRON HCL 4 MG PO TABS: 4.0000 mg | ORAL_TABLET | Freq: Four times a day (QID) | ORAL | Status: DC | PRN

## 2022-10-19 MED ORDER — ACETAMINOPHEN 650 MG RE SUPP: 650.0000 mg | Freq: Four times a day (QID) | RECTAL | Status: DC | PRN

## 2022-10-19 MED ORDER — ENOXAPARIN SODIUM 40 MG/0.4ML IJ SOSY
40.0000 mg | PREFILLED_SYRINGE | INTRAMUSCULAR | Status: DC
  Filled 2022-10-19: qty 0.4

## 2022-10-19 MED ORDER — FLUTICASONE PROPIONATE 50 MCG/ACT NA SUSP
2.0000 | Freq: Every day | NASAL | Status: DC
  Administered 2022-10-19 – 2022-10-22 (×4): 2 via NASAL
  Filled 2022-10-19: qty 16

## 2022-10-19 MED ORDER — ZOLPIDEM TARTRATE 5 MG PO TABS
5.0000 mg | ORAL_TABLET | Freq: Every evening | ORAL | Status: DC | PRN
  Administered 2022-10-19 – 2022-10-21 (×3): 5 mg via ORAL
  Filled 2022-10-19 (×3): qty 1

## 2022-10-19 MED ORDER — VANCOMYCIN HCL 2000 MG/400ML IV SOLN
2000.0000 mg | Freq: Once | INTRAVENOUS | Status: AC
  Administered 2022-10-19: 2000 mg via INTRAVENOUS
  Filled 2022-10-19: qty 400

## 2022-10-19 MED ORDER — VANCOMYCIN HCL 1500 MG/300ML IV SOLN
1500.0000 mg | Freq: Two times a day (BID) | INTRAVENOUS | Status: DC
  Administered 2022-10-20 (×2): 1500 mg via INTRAVENOUS
  Filled 2022-10-19 (×3): qty 300

## 2022-10-19 MED ORDER — FAMOTIDINE-CA CARB-MAG HYDROX 10-800-165 MG PO CHEW: 2.0000 | CHEWABLE_TABLET | Freq: Every day | ORAL | Status: DC | PRN

## 2022-10-19 MED ORDER — ACETAMINOPHEN 500 MG PO TABS
1000.0000 mg | ORAL_TABLET | Freq: Once | ORAL | Status: AC
  Administered 2022-10-19: 1000 mg via ORAL
  Filled 2022-10-19: qty 2

## 2022-10-19 MED ORDER — LORATADINE 10 MG PO TABS
10.0000 mg | ORAL_TABLET | Freq: Every day | ORAL | Status: DC
  Administered 2022-10-19 – 2022-10-22 (×4): 10 mg via ORAL
  Filled 2022-10-19 (×4): qty 1

## 2022-10-19 MED ORDER — ALBUTEROL SULFATE (2.5 MG/3ML) 0.083% IN NEBU: 2.5000 mg | INHALATION_SOLUTION | Freq: Four times a day (QID) | RESPIRATORY_TRACT | Status: DC | PRN

## 2022-10-19 MED ORDER — FAMOTIDINE 20 MG PO TABS: 10.0000 mg | ORAL_TABLET | Freq: Every day | ORAL | Status: DC | PRN

## 2022-10-19 MED ORDER — SODIUM CHLORIDE 0.9 % IV SOLN
2.0000 g | Freq: Three times a day (TID) | INTRAVENOUS | Status: DC
  Administered 2022-10-19 – 2022-10-21 (×5): 2 g via INTRAVENOUS
  Filled 2022-10-19 (×5): qty 12.5

## 2022-10-19 MED ORDER — ENOXAPARIN SODIUM 60 MG/0.6ML IJ SOSY
0.5000 mg/kg | PREFILLED_SYRINGE | INTRAMUSCULAR | Status: DC
  Administered 2022-10-20 – 2022-10-21 (×2): 60 mg via SUBCUTANEOUS
  Filled 2022-10-19 (×2): qty 0.6

## 2022-10-19 MED ORDER — HYDROCHLOROTHIAZIDE 12.5 MG PO TABS
12.5000 mg | ORAL_TABLET | Freq: Every morning | ORAL | Status: DC
  Administered 2022-10-19 – 2022-10-22 (×4): 12.5 mg via ORAL
  Filled 2022-10-19 (×4): qty 1

## 2022-10-19 MED ORDER — ACETAMINOPHEN 325 MG PO TABS
650.0000 mg | ORAL_TABLET | Freq: Four times a day (QID) | ORAL | Status: DC | PRN
  Administered 2022-10-19 – 2022-10-20 (×2): 650 mg via ORAL
  Filled 2022-10-19 (×2): qty 2

## 2022-10-19 NOTE — Progress Notes (Signed)
Pt arrived to floor around 0845 by [facility]. Pt amb from stretcher to hospital bed with steady gait. Pt alert and oriented x4 in no acute distress. VSS. Respirations even and unlabored on room air. Pt oriented to room. Bed in low position. Call bell with reach.

## 2022-10-19 NOTE — Progress Notes (Signed)
MEDICATION RELATED CONSULT NOTE - INITIAL   Pharmacy Consult for Enoxaparin - pharmacy may adjust dose Indication: VTE prophylaxis  No Known Allergies  Patient Measurements: Height: 6' (182.9 cm) Weight: 119.8 kg (264 lb 1.8 oz) IBW/kg (Calculated) : 77.6   Labs: Recent Labs    10/18/22 2042 10/19/22 0948  WBC 14.9* 15.9*  HGB 14.5 13.2  HCT 41.7 37.1*  PLT 354 376  CREATININE 0.89 0.88  ALBUMIN 3.6  --   PROT 8.3*  --   AST 21  --   ALT 14  --   ALKPHOS 67  --   BILITOT 0.9  --    Estimated Creatinine Clearance: 131.3 mL/min (by C-G formula based on SCr of 0.88 mg/dL).     Medical History: Past Medical History:  Diagnosis Date   Allergy    GERD (gastroesophageal reflux disease)      Assessment:  Enoxaparin 40mg  every 24 hours ordered for VTE prophylaxis with pharmacy may adjust noted.    51 y.o male,  weight 119.8 kg,  BMI = 35.82.   SCr 0.88, estimated CrCl 0.88.  Hgb 15.9,  Hct 37.1, Pltc 376.    Goal of Therapy:  VTE prophylaxis  Plan:  Adjusted enoxaparin dose  60 mg q24hr (= 0.5 mg/kg q24hr) due to BMI >30, with normal renal function. Monitor for s/sx of bleeding, weight change, and renal function changes.  Pharmacy will sign off.   Thank you for allowing pharmacy to be part of this patients care team. Nicole Cella, [facility] Pharmacist  10/19/2022,9:27 PM

## 2022-10-19 NOTE — H&P (Signed)
History and Physical    Patient: Louis Short IRW:[id number] DOB: 11-11-1971 DOA: 10/18/2022 DOS: the patient was seen and examined on 10/19/2022 PCP: Alysia Penna, MD  Patient coming from: Transfer from [facility]  Chief Complaint:  Chief Complaint  Patient presents with   Shortness of Breath   HPI: Louis Short is a 51 y.o. male with medical history significant of allergies and GERD who presented after being noted to have abnormal CT scan of the chest yesterday.  History is obtained from review of records as well as talks with the patient.  Currently, the patient is upset at the prolonged wait times.  He has had a 2-week history of cough and shortness of breath.   Noted associated symptoms of fever and left-sided chest pain.  Denied having any significant nausea and vomiting. Evaluated at [facility] urgent care on 11/1 where x-rays had noted concern for small left-sided pleural effusion and adjacent basilar airspace disease concerning for pneumonia versus atelectasis.  At that time he was prescribed  5-day course of azithromycin, amoxicillin, and furosemide 20 mg daily which he completed.  Due to persistence of symptoms his primary ordered a CT angiogram of the chest to be obtained.  CT angiogram of the chest noted a a large left pleural effusion increased in size from prior exam with complete  compressive atelectasis of the left lower lobe with effusion causing mass effect and rightward medial shift of the heart and mediastinal structures.  He was advised to go to the hospital.  In the emergency department patient was noted to be febrile up to 100.8 F with tachycardia and tachypnea.  Labs from 11/6 significant for WBC 14.9, sodium 133, chloride 94, and lactic acid limits x2.  Influenza and COVID-19 screening were negative.  Blood cultures have been obtained.  Patient has been given vancomycin, cefepime, and acetaminophen.  Review of Systems: Unable to review all systems due to lack of  cooperation from patient. Past Medical History:  Diagnosis Date   Allergy    GERD (gastroesophageal reflux disease)    Past Surgical History:  Procedure Laterality Date   VASECTOMY     Social History:  reports that he quit smoking about 22 years ago. He has never used smokeless tobacco. He reports that he does not drink alcohol and does not use drugs.  No Known Allergies  Family History  Problem Relation Age of Onset   Arthritis Other    Diabetes Other        1st degree relative   Allergies Brother     Prior to Admission medications   Medication Sig Start Date End Date Taking? Authorizing Provider  allopurinol (ZYLOPRIM) 300 MG tablet Take 1 tablet (300 mg total) by mouth daily. 08/06/13   Etta Grandchild, MD  azithromycin (ZITHROMAX) 250 MG tablet Take 1 tablet (250 mg total) by mouth daily. Take first 2 tablets together, then 1 every day until finished. 10/13/22   Rafoth, Baldemar Friday, MD  cetirizine (ZYRTEC) 10 MG tablet Take 10 mg by mouth daily as needed.      [provider]  clonazePAM (KLONOPIN) 1 MG tablet TAKE 1 TABLET BY MOUTH TWICE A DAY AS NEEDED 10/08/13   Etta Grandchild, MD  colchicine 0.6 MG tablet Take 1 tablet (0.6 mg total) by mouth daily. 08/25/12 08/25/13  Etta Grandchild, MD  fluticasone (FLONASE) 50 MCG/ACT nasal spray 2 sprays by Nasal route daily.      [provider]  furosemide (LASIX) 20 MG  tablet Take 1 tablet (20 mg total) by mouth daily for 5 days. 10/13/22 10/18/22  Rafoth, Dorian Pod, MD  hydrochlorothiazide (HYDRODIURIL) 12.5 MG tablet Take 12.5 mg by mouth every morning. 10/04/22   [provider]  lisinopril (ZESTRIL) 20 MG tablet Take 20 mg by mouth daily. 10/04/22   [provider]  ranitidine (ZANTAC) 150 MG capsule Take 150 mg by mouth as needed.      [provider]    Physical Exam: Vitals:   10/19/22 0541 10/19/22 0600 10/19/22 0731 10/19/22 0858  BP:  128/79    Pulse:  79  96  Resp:  19  (!) 21   Temp: 98.7 F (37.1 C)  98.5 F (36.9 C) 98.5 F (36.9 C)  TempSrc:   Oral Oral  SpO2:  93%  94%  Weight:    119.8 kg  Height:    6' (1.829 m)   Exam  Constitutional: Middle-age male currently in no acute distress Eyes:  lids and conjunctivae normal ENMT: Mucous membranes are moist.   Neck: normal, supple  Respiratory: Clear to auscultation with decreased aeration along the left mid to lower lung field.  No significant wheezes or rhonchi appreciated. Cardiovascular: Regular rate and rhythm, no murmurs / rubs / gallops. No extremity edema.    Musculoskeletal: no clubbing / cyanosis.  No gross joint deformity appreciated. Skin: no rashes, lesions, ulcers. No induration Neurologic: CN 2-12 grossly intact. Sensation intact, DTR normal. Strength 5/5 in all 4.  Psychiatric: Normal judgment and insight. Alert and oriented x 3. Normal mood.   Data Reviewed:  EKG revealed sinus tachycardia 115 bpm.  Reviewed labs, imaging, and pertinent records as noted above in HPI  Assessment and Plan: Sepsis secondary to left sided exudative pleural effusion Patient was noted to be febrile up to 100.8 F with tachycardia and tachypnea meeting SIRS criteria.  Blood cell count elevated up to 15.9 on recheck today with lactic acid is reassuring at 0.9.  Patient underwent thoracentesis with removal of 1.1 L of dark yellow fluid.  Pleural fluid protein/serum protein greater than 0.5 to suggest exudative effusion.  Gram stain did not show any bacteria. -Admit to a progressive bed -Follow-up blood and pleural fluid cultures -Follow-up pending pleural studies -Continue empiric antibiotics of vancomycin and cefepime.  De-escalate when medically appropriate. -Oxycodone as needed for pain -Tylenol as needed for fever  Essential hypertension Blood pressures elevated up to 154/93.  Home blood pressure regimen includes lisinopril 20 mg daily and hydrochlorothiazide 12.5 mg daily. -Continue home  regimen -Hydralazine IV as needed for elevated blood pressure  Hyponatremia and hypochloremia Acute.  Sodium 133-> 134 and chloride 94->96.  Suspect likely related to patient being on diuretics setting of acute infection. -Continue to montior  Anxiety/panic disorder -Klonopin as needed  GERD -Pepcid complete as needed for indigestion  Obesity BMI 35.82 kg/m   Patient reported likely need of something to sleep. -Ambien added as needed for sleep   Advance Care Planning:   Code Status: Full Code   Consults: None  Family Communication: Family updated at bedside  Severity of Illness: The appropriate patient status for this patient is INPATIENT. Inpatient status is judged to be reasonable and necessary in order to provide the required intensity of service to ensure the patient's safety. The patient's presenting symptoms, physical exam findings, and initial radiographic and laboratory data in the context of their chronic comorbidities is felt to place them at high risk for further clinical deterioration. Furthermore, it  is not anticipated that the patient will be medically stable for discharge from the hospital within 2 midnights of admission.   * I certify that at the point of admission it is my clinical judgment that the patient will require inpatient hospital care spanning beyond 2 midnights from the point of admission due to high intensity of service, high risk for further deterioration and high frequency of surveillance required.*  Author: Clydie Braun, MD 10/19/2022 9:15 AM  For on call review [email or web address].

## 2022-10-19 NOTE — Procedures (Signed)
PROCEDURE SUMMARY:  Successful US guided left thoracentesis. Yielded 1.1 L of dark yellow fluid. Pt tolerated procedure well. No immediate complications.  Specimen sent for labs. CXR ordered; no post-procedure pneumothorax identified.   EBL < 2 mL  Theresa Duty, NP 10/19/2022 11:26 AM

## 2022-10-19 NOTE — Progress Notes (Signed)
Pharmacy Antibiotic Note  Louis Short is a 51 y.o. male  presented with shortness of breath, admitted on 10/18/2022 with possible pneumonia secondary to developing empyema.  Empiric antibiotics started 11/6 ine ED.  Pharmacy has been consulted on 10/19/22  for Vancomycin dosing for sepsis secondary to left pleural effusion.  He received Vancomycin 1000 mg IV x1 in ED on 11/6 @ 2051  plus additional 500 mg to total 1500 mg Vancomycin last night.  Continues on Cefepime 2g  IV q8 hrs.   Plan: Vancomycin 2000 mg IV x 1 dose now then Vancomycin 1500 mg  Q 24 hrs. Goal AUC 400-550. Expected AUC: 533.6 SCr used: 0.88,  Vd 0.5 L/kg due to BMI >30    Height: 6' (182.9 cm) Weight: 119.8 kg (264 lb 1.8 oz) IBW/kg (Calculated) : 77.6  Temp (24hrs), Avg:99.6 F (37.6 C), Min:98.5 F (36.9 C), Max:100.8 F (38.2 C)  Recent Labs  Lab 10/18/22 2042 10/18/22 2043 10/18/22 2317 10/19/22 0948  WBC 14.9*  --   --  15.9*  CREATININE 0.89  --   --  0.88  LATICACIDVEN  --  1.0 0.9  --     Estimated Creatinine Clearance: 131.3 mL/min (by C-G formula based on SCr of 0.88 mg/dL).    No Known Allergies  Antimicrobials this admission: Vancomycin 11/6 >>  Cefepime 11/6>>   Dose adjustments this admission:   Microbiology results: 11/6 Blood cx: sent 11/6 Blood cx: sent 11/7 pleural fluid Cx:  pending   Thank you for allowing pharmacy to be a part of this patient's care. Nicole Cella, RPh Clinical Pharmacist  10/19/2022 5:54 PM

## 2022-10-19 NOTE — ED Notes (Signed)
Patient OOB to [facility].   

## 2022-10-19 NOTE — ED Notes (Signed)
Handoff report given to [facility] 

## 2022-10-19 NOTE — ED Notes (Signed)
Handoff report sent to Caryl Pina RN on 5W at [facility]

## 2022-10-20 ENCOUNTER — Inpatient Hospital Stay (HOSPITAL_COMMUNITY): Payer: No Typology Code available for payment source

## 2022-10-20 DIAGNOSIS — J90 Pleural effusion, not elsewhere classified: Secondary | ICD-10-CM | POA: Diagnosis not present

## 2022-10-20 LAB — CBC
HCT: 38.7 % — ABNORMAL LOW (ref 39.0–52.0)
Hemoglobin: 13.4 g/dL (ref 13.0–17.0)
MCH: 33.5 pg (ref 26.0–34.0)
MCHC: 34.6 g/dL (ref 30.0–36.0)
MCV: 96.8 fL (ref 80.0–100.0)
Platelets: 389 10*3/uL (ref 150–400)
RBC: 4 MIL/uL — ABNORMAL LOW (ref 4.22–5.81)
RDW: 11.6 % (ref 11.5–15.5)
WBC: 11.2 10*3/uL — ABNORMAL HIGH (ref 4.0–10.5)
nRBC: 0 % (ref 0.0–0.2)

## 2022-10-20 LAB — COMPREHENSIVE METABOLIC PANEL
ALT: 21 U/L (ref 0–44)
AST: 28 U/L (ref 15–41)
Albumin: 2.1 g/dL — ABNORMAL LOW (ref 3.5–5.0)
Alkaline Phosphatase: 58 U/L (ref 38–126)
Anion gap: 11 (ref 5–15)
BUN: 15 mg/dL (ref 6–20)
CO2: 27 mmol/L (ref 22–32)
Calcium: 8.8 mg/dL — ABNORMAL LOW (ref 8.9–10.3)
Chloride: 98 mmol/L (ref 98–111)
Creatinine, Ser: 0.99 mg/dL (ref 0.61–1.24)
GFR, Estimated: >60 mL/min (ref >60)
Glucose, Bld: 94 mg/dL (ref 70–99)
Potassium: 3.7 mmol/L (ref 3.5–5.1)
Sodium: 136 mmol/L (ref 135–145)
Total Bilirubin: 0.8 mg/dL (ref 0.3–1.2)
Total Protein: 6.7 g/dL (ref 6.5–8.1)

## 2022-10-20 LAB — MAGNESIUM: Magnesium: 2.3 mg/dL (ref 1.7–2.4)

## 2022-10-20 LAB — LACTATE DEHYDROGENASE: LDH: 109 U/L (ref 98–192)

## 2022-10-20 LAB — C-REACTIVE PROTEIN: CRP: 24.6 mg/dL — ABNORMAL HIGH (ref <1.0)

## 2022-10-20 LAB — BRAIN NATRIURETIC PEPTIDE: B Natriuretic Peptide: 20.8 pg/mL (ref 0.0–100.0)

## 2022-10-20 LAB — PROCALCITONIN: Procalcitonin: 0.47 ng/mL

## 2022-10-20 MED ORDER — SODIUM CHLORIDE (PF) 0.9 % IJ SOLN
10.0000 mg | Freq: Once | INTRAMUSCULAR | Status: AC
  Administered 2022-10-20: 10 mg via INTRAPLEURAL
  Filled 2022-10-20 (×2): qty 10

## 2022-10-20 MED ORDER — SODIUM CHLORIDE 0.9% FLUSH
10.0000 mL | Freq: Three times a day (TID) | INTRAVENOUS | Status: DC
  Administered 2022-10-20 – 2022-10-22 (×6): 10 mL via INTRAPLEURAL

## 2022-10-20 MED ORDER — STERILE WATER FOR INJECTION IJ SOLN
5.0000 mg | Freq: Once | RESPIRATORY_TRACT | Status: AC
  Administered 2022-10-20: 5 mg via INTRAPLEURAL
  Filled 2022-10-20 (×2): qty 5

## 2022-10-20 NOTE — Progress Notes (Signed)
PROGRESS NOTE                                                                                                                                                                                                             Patient Demographics:    Louis Short, is a 51 y.o. male, DOB - [birth year], [id number]  Outpatient Primary MD for the patient is Velna Hatchet, MD    LOS - 1  Admit date - 10/18/2022    Chief Complaint  Patient presents with   Shortness of Breath       Brief Narrative (HPI from H&P)   51 y.o. male with medical history significant of allergies and GERD who presented after being noted to have abnormal CT scan of the chest yesterday.  History is obtained from review of records as well as talks with the patient.  Currently, the patient is upset at the prolonged wait times.  He has had a 2-week history of cough and shortness of breath.   In the ER he was found to have a fever, work-up suggestive of large left-sided effusion with possible left-sided pneumonia.  He was admitted for further treatment.   Subjective:    [name] today has, No headache, No chest pain, No abdominal pain - No Nausea, No new weakness tingling or numbness, improved shortness of breath.   Assessment  & Plan :    Sepsis secondary to left sided exudative pleural effusion -  had upon admission, he likely had community-acquired pneumonia with parapneumonic, effusion is frankly exudative, on x-ray still has considerable residual left-sided effusion, will discuss with pulmonary as well, follow-up pending cultures , continue empiric IV antibiotics and monitor.  HIV panel is negative no history of aspiration.  Essential hypertension - now stable on home regimen of ACE inhibitor and HCTZ.  As needed IV hydralazine on board as well.      Hyponatremia and hypochloremia - due shin, resolved after IV fluids.   Anxiety/panic disorder   -Klonopin as needed   GERD  -Pepcid complete as needed for indigestion   Obesity  BMI 35.82 kg/m, follow-up with PCP.   Excessive alcohol use.  He sees he drinks way more than he should, last drink 1 week ago, counseled to quit, monitor for DTs.        Condition - Extremely Guarded  Family Communication  : None present  Code Status : Full code  Consults  : Pulmonary  PUD Prophylaxis : PPI   Procedures  :     CT -   1. No pulmonary embolus to the segmental level. The subsegmental branches are not well assessed due to contrast bolus timing. 2. Large left pleural effusion which has increased in size from prior exam. There is complete compressive atelectasis of the left lower lobe and subtotal compressive atelectasis of the left upper lobe. Effusion causes mild mass effect and rightward medial shift of the heart and mediastinal structures. 3. The right lung is clear. 4. Aortic atherosclerosis.  Coronary artery calcifications. 5. Suspected hepatic steatosis which is not well assessed due to phase of contrast. Aortic Atherosclerosis  Ultrasound-guided left pleurocentesis.  With 1 L of pleural fluid removed      Disposition Plan  :    Status is: Inpatient  DVT Prophylaxis  :      Lab Results  Component Value Date   PLT 389 10/20/2022    Diet :  Diet Order             Diet regular Room service appropriate? Yes; Fluid consistency: Thin  Diet effective now                    Inpatient Medications  Scheduled Meds:  enoxaparin (LOVENOX) injection  0.5 mg/kg Subcutaneous Q24H   fluticasone  2 spray Each Nare Daily   hydrochlorothiazide  12.5 mg Oral q morning   lisinopril  20 mg Oral Daily   loratadine  10 mg Oral Daily   Continuous Infusions:  ceFEPime (MAXIPIME) IV 2 g (10/20/22 1014)   vancomycin 1,500 mg (10/20/22 0809)   PRN Meds:.acetaminophen **OR** acetaminophen, albuterol, calcium carbonate, clonazePAM, famotidine, hydrALAZINE, ondansetron **OR**  ondansetron (ZOFRAN) IV, oxyCODONE, zolpidem     Objective:   Vitals:   10/19/22 1804 10/19/22 1927 10/19/22 2312 10/20/22 0333  BP: (!) 143/86 132/73 113/79 126/80  Pulse: 95 94 88 79  Resp: (!) 22 20 19  (!) 22  Temp:  98.9 F (37.2 C) 98.8 F (37.1 C) 98.4 F (36.9 C)  TempSrc:  Oral Oral Oral  SpO2: 99% 96% 97% 95%  Weight:      Height:        Wt Readings from Last 3 Encounters:  10/19/22 119.8 kg  03/29/13 123.8 kg  08/25/12 122 kg     Intake/Output Summary (Last 24 hours) at 10/20/2022 1042 Last data filed at 10/20/2022 0600 Gross per 24 hour  Intake 440 ml  Output --  Net 440 ml     Physical Exam  Awake Alert, No new F.N deficits, Normal affect Mathews.AT,PERRAL Supple Neck, No JVD,   Symmetrical Chest wall movement, Good air movement bilaterally, CTAB RRR,No Gallops,Rubs or new Murmurs,  +ve B.Sounds, Abd Soft, No tenderness,   No Cyanosis, Clubbing or edema        Data Review:    CBC Recent Labs  Lab 10/18/22 2042 10/19/22 0948 10/20/22 0635  WBC 14.9* 15.9* 11.2*  HGB 14.5 13.2 13.4  HCT 41.7 37.1* 38.7*  PLT 354 376 389  MCV 96.5 96.6 96.8  MCH 33.6 34.4* 33.5  MCHC 34.8 35.6 34.6  RDW 11.8 11.8 11.6  LYMPHSABS 1.0 0.9  --   MONOABS 1.5* 1.5*  --   EOSABS 0.0 0.1  --   BASOSABS 0.1 0.1  --     Electrolytes Recent Labs  Lab 10/18/22  2042 10/18/22 2043 10/18/22 2317 10/19/22 0948 10/20/22 0635  NA 133*  --   --  134* 136  K 4.2  --   --  3.8 3.7  CL 94*  --   --  96* 98  CO2 26  --   --  27 27  GLUCOSE 107*  --   --  113* 94  BUN 16  --   --  14 15  CREATININE 0.89  --   --  0.88 0.99  CALCIUM 9.7  --   --  9.0 8.8*  AST 21  --   --   --  28  ALT 14  --   --   --  21  ALKPHOS 67  --   --   --  58  BILITOT 0.9  --   --   --  0.8  ALBUMIN 3.6  --   --   --  2.1*  MG  --   --   --   --  2.3  CRP  --   --   --   --  24.6*  PROCALCITON  --   --   --   --  0.47  LATICACIDVEN  --  1.0 0.9  --   --   BNP  --   --   --   --  20.8      ID Labs Recent Labs  Lab 10/18/22 2042 10/18/22 2043 10/18/22 2317 10/19/22 0948 10/20/22 0635  WBC 14.9*  --   --  15.9* 11.2*  PLT 354  --   --  376 389  CRP  --   --   --   --  24.6*  PROCALCITON  --   --   --   --  0.47  LATICACIDVEN  --  1.0 0.9  --   --   CREATININE 0.89  --   --  0.88 0.99    Radiology Reports DG Chest Port 1 View  Result Date: 10/20/2022 CLINICAL DATA:  Shortness of breath. EXAM: PORTABLE CHEST 1 VIEW COMPARISON:  10/19/2022 FINDINGS: Large left pleural effusion again noted with collapse/consolidation in the visualized lower left lung. Right lung clear. Cardiopericardial silhouette is stable. Telemetry leads overlie the chest. IMPRESSION: Large left pleural effusion with collapse/consolidation in the visualized lower left lung. Electronically Signed   By: Misty Stanley M.D.   On: 10/20/2022 06:42   IR THORACENTESIS ASP PLEURAL SPACE W/IMG GUIDE  Result Date: 10/19/2022 INDICATION: Patient admitted for shortness of breath and found to have large left-sided pleural effusion. Interventional radiology asked to perform a diagnostic and therapeutic thoracentesis. EXAM: ULTRASOUND GUIDED THORACENTESIS MEDICATIONS: 1% lidocaine 15 mL COMPLICATIONS: None immediate. PROCEDURE: An ultrasound guided thoracentesis was thoroughly discussed with the patient and questions answered. The benefits, risks, alternatives and complications were also discussed. The patient understands and wishes to proceed with the procedure. Written consent was obtained. Ultrasound was performed to localize and mark an adequate pocket of fluid in the left chest. The area was then prepped and draped in the normal sterile fashion. 1% Lidocaine was used for local anesthesia. Under ultrasound guidance a 6 Fr Safe-T-Centesis catheter was introduced. Thoracentesis was performed. The catheter was removed and a dressing applied. FINDINGS: A total of approximately 1.1 L of dark yellow fluid was removed.  Samples were sent to the laboratory as requested by the clinical team. IMPRESSION: Successful ultrasound guided left thoracentesis yielding 1.1 L of pleural fluid. Read by: Soyla Dryer, NP  Electronically Signed   By: Malachy Moan M.D.   On: 10/19/2022 12:48   DG Chest 1 View  Result Date: 10/19/2022 CLINICAL DATA:  Status post left thoracentesis with removal of 1.1 L EXAM: CHEST  1 VIEW COMPARISON:  CT angio chest 10/18/2022 FINDINGS: The cardiac silhouette is obscured by the large left effusion. Decreased volume of left pleural effusion status post thoracentesis. Improved aeration to the left upper lobe. No signs of pneumothorax. Right lung appears clear. IMPRESSION: 1. No pneumothorax after left thoracentesis. Electronically Signed   By: Signa Kell M.D.   On: 10/19/2022 11:14   CT Angio Chest Pulmonary Embolism (PE) W or WO Contrast  Result Date: 10/18/2022 CLINICAL DATA:  Pleural effusion. EXAM: CT ANGIOGRAPHY CHEST WITH CONTRAST TECHNIQUE: Multidetector CT imaging of the chest was performed using the standard protocol during bolus administration of intravenous contrast. Multiplanar CT image reconstructions and MIPs were obtained to evaluate the vascular anatomy. RADIATION DOSE REDUCTION: This exam was performed according to the departmental dose-optimization program which includes automated exposure control, adjustment of the mA and/or kV according to patient size and/or use of iterative reconstruction technique. CONTRAST:  62mL OMNIPAQUE IOHEXOL 350 MG/ML SOLN COMPARISON:  Rib radiographs 10/13/2022 FINDINGS: Cardiovascular: There is no pulmonary embolus to the segmental level. The subsegmental branches are not well assessed due to contrast bolus timing. The left pulmonary arteries are further limited due to compression related to atelectasis. Heart is normal in size. There is slight rightward displacement of the heart and mediastinal structures due to large left pleural effusion. There are  coronary artery calcifications. No pericardial effusion. The thoracic aorta is normal in caliber with mild atherosclerosis. No dissection. Mediastinum/Nodes: Scattered mediastinal lymph nodes, largest in the anterior paratracheal station measuring 9 mm. No enlarged lymph nodes by size criteria. Left hilar assessment is limited due to adjacent pleural fluid and atelectasis. There is no obvious bulky adenopathy. The esophagus is decompressed. Lungs/Pleura: Large left pleural effusion which has increased in size from prior exam. Pleural fluid measures simple fluid density. There is no pleural enhancement. Effusion is partially loculated. There is complete compressive atelectasis of the left lower lobe and subtotal compressive atelectasis of the left upper lobe. Allowing for motion artifact the right lung is clear. There is no right pleural effusion. The trachea and central airways are patent. No pulmonary mass or nodule in the aerated lung, left lung assessment is limited due to compressive atelectasis. Upper Abdomen: No acute upper abdominal findings. Suspected hepatic steatosis which is not well assessed due to phase of contrast. Musculoskeletal: Faint sclerotic density within the left lateral sixth rib which has nonaggressive features. No suspicious bone lesion or rib fracture. Review of the MIP images confirms the above findings. IMPRESSION: 1. No pulmonary embolus to the segmental level. The subsegmental branches are not well assessed due to contrast bolus timing. 2. Large left pleural effusion which has increased in size from prior exam. There is complete compressive atelectasis of the left lower lobe and subtotal compressive atelectasis of the left upper lobe. Effusion causes mild mass effect and rightward medial shift of the heart and mediastinal structures. 3. The right lung is clear. 4. Aortic atherosclerosis.  Coronary artery calcifications. 5. Suspected hepatic steatosis which is not well assessed due to  phase of contrast. Aortic Atherosclerosis (ICD10-I70.0). These results were called by telephone at the time of interpretation on 10/18/2022 at 7:28 pm to provider Timothy Lasso , who verbally acknowledged these results. Given the size of the effusion that has  increased, the patient was referred to the emergency room for further evaluation and treatment. This information was conveyed to the covering technologist to direct the patient to the ER. Electronically Signed   By: Keith Rake M.D.   On: 10/18/2022 19:29      Signature  Lala Lund M.D on 10/20/2022 at 10:42 AM   -  To page go to [email or web address]

## 2022-10-20 NOTE — Consult Note (Signed)
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  He has chronic sinus drainage postnasal drip.  He reports intermittent fevers for over 2 weeks mild shortness of breath and nonproductive cough.  He had radiographic evaluation that showed a large left effusion.  He was sent to interventional radiology for thoracentesis 10/19/2022 with 1.1 L of fluid obtained transudative in nature.  Serial chest x-ray shows reaccumulation of pleural effusion and at this time pulmonary critical care was called and asked to place pigtail catheter for further evaluation and treatment.  He is on antimicrobial therapy at this time.  We will place chest tube and continue to monitor.  There may be a role for lytics at the end of this.  Pertinent  Medical History   Past Medical History:  Diagnosis Date   Allergy    GERD (gastroesophageal reflux disease)      Significant Hospital Events: Including procedures, antibiotic start and stop dates in addition to other pertinent events   Plan for pigtail catheter of left side  Interim History / Subjective:  Knees pigtail catheter  Objective   Blood pressure 126/80, pulse 79, temperature 98.4 F (36.9 C), temperature source Oral, resp. rate (!) 22, height 6' (1.829 m), weight 119.8 kg, SpO2 95 %.        Intake/Output Summary (Last 24 hours) at 10/20/2022 1141 Last data filed at 10/20/2022 0600 Gross per 24 hour  Intake 440 ml  Output --  Net 440 ml   Filed Weights   10/19/22 0858  Weight: 119.8 kg    Examination: General: Well-nourished well-developed male no acute distress HENT: Chronic sinus drainage is noted with postnasal drip Lungs: Decreased  breath sounds left three quarters down Cardiovascular: Heart sounds are regular  Abdomen:  soft nontender positive bowel sounds Extremities: Without edema Neuro: Grossly intact without focal defect GU: Voids  Resolved Hospital Problem list     Assessment & Plan:  Large left pleural effusion transudative in nature status post interventional radiology thoracentesis 10/29/2022 with 1.1 L obtained.  Now with recurrent larger left pleural effusion suspected empyema pulmonary critical care called to evaluate in place pigtail catheter Placed pigtail catheters 20 cm suction Culture Possibility he may need lytics in the future Drain till dry Agree with antimicrobial therapy Serial chest x-rays  Chronic sinus congestion with obvious postnasal drip Consider CT of sinuses Current antibiotics should cover any bacterial infection Consider nasal hygiene with nasal salt water Nasonex with twice daily     Best Practice (right click and "Reselect all SmartList Selections" daily)   Diet/type: Regular consistency (see orders) DVT prophylaxis: prophylactic heparin  GI prophylaxis: YesPPI Lines: N/A Foley:  N/A Code Status:  full code Last date of multidisciplinary goals of care discussion [tbd]  Labs   CBC: Recent Labs  Lab 10/18/22 2042 10/19/22 0948 10/20/22 0635  WBC 14.9* 15.9* 11.2*  NEUTROABS 12.1* 12.7*  --   HGB 14.5 13.2 13.4  HCT 41.7 37.1* 38.7*  MCV 96.5 96.6 96.8  PLT 354 376 389    Basic Metabolic Panel: Recent Labs  Lab 10/18/22 2042 10/19/22 0948 10/20/22 0635  NA 133* 134* 136  K 4.2 3.8 3.7  CL 94* 96* 98  CO2 26  27 27  GLUCOSE 107* 113* 94  BUN 16 14 15   CREATININE 0.89 0.88 0.99  CALCIUM 9.7 9.0 8.8*  MG  --   --  2.3   GFR: Estimated Creatinine Clearance: 116.7 mL/min (by C-G formula based on SCr of 0.99 mg/dL). Recent Labs  Lab 10/18/22 2042 10/18/22 2043 10/18/22 2317 10/19/22 0948 10/20/22 0635  PROCALCITON  --   --   --   --  0.47   WBC 14.9*  --   --  15.9* 11.2*  LATICACIDVEN  --  1.0 0.9  --   --     Liver Function Tests: Recent Labs  Lab 10/18/22 2042 10/20/22 0635  AST 21 28  ALT 14 21  ALKPHOS 67 58  BILITOT 0.9 0.8  PROT 8.3* 6.7  ALBUMIN 3.6 2.1*   Recent Labs  Lab 10/18/22 2042  LIPASE 26   No results for input(s): "AMMONIA" in the last 168 hours.  ABG No results found for: "PHART", "PCO2ART", "PO2ART", "HCO3", "TCO2", "ACIDBASEDEF", "O2SAT"   Coagulation Profile: No results for input(s): "INR", "PROTIME" in the last 168 hours.  Cardiac Enzymes: No results for input(s): "CKTOTAL", "CKMB", "CKMBINDEX", "TROPONINI" in the last 168 hours.  HbA1C: No results found for: "HGBA1C"  CBG: Recent Labs  Lab 10/19/22 1326  GLUCAP 137*    Review of Systems:   10 point review of system taken, please see HPI for positives and negatives. Positive for 2-week history of fever, cough that is nonproductive mild shortness of breath.  Note that he has chronic sinus drainage and has had so for years.  Past Medical History:  He,  has a past medical history of Allergy and GERD (gastroesophageal reflux disease).   Surgical History:   Past Surgical History:  Procedure Laterality Date   IR THORACENTESIS ASP PLEURAL SPACE W/IMG GUIDE  10/19/2022   VASECTOMY       Social History:   reports that he quit smoking about 22 years ago. He has never used smokeless tobacco. He reports that he does not drink alcohol and does not use drugs.   Family History:  His family history includes Allergies in his brother; Arthritis in an other family member; Diabetes in an other family member.   Allergies No Known Allergies   Home Medications  Prior to Admission medications   Medication Sig Start Date End Date Taking? Authorizing Provider  allopurinol (ZYLOPRIM) 300 MG tablet Take 1 tablet (300 mg total) by mouth daily. Patient taking differently: Take 300 mg by mouth daily as needed (gout flare). 08/06/13  Yes  08/08/13, MD  aspirin EC 81 MG tablet Take 81 mg by mouth daily. 09/17/14  Yes [provider]  cetirizine (ZYRTEC) 10 MG tablet Take 10 mg by mouth daily.   Yes [provider]  clonazePAM (KLONOPIN) 1 MG tablet TAKE 1 TABLET BY MOUTH TWICE A DAY AS NEEDED Patient taking differently: Take 1 mg by mouth 2 (two) times daily as needed for anxiety. 10/08/13  Yes 10/10/13, MD  famotidine-calcium carbonate-magnesium hydroxide (PEPCID COMPLETE) 10-800-165 MG chewable tablet Chew 2 tablets by mouth daily as needed (indigestion).   Yes [provider]  fluticasone (FLONASE) 50 MCG/ACT nasal spray 2 sprays by Nasal route daily.     Yes [provider]  furosemide (LASIX) 20 MG tablet Take 1 tablet (20 mg total) by mouth daily for 5 days. 10/13/22 10/19/22 Yes Rafoth, 13/7/23, MD  hydrochlorothiazide (HYDRODIURIL) 12.5 MG tablet Take 12.5 mg  by mouth every morning. 10/04/22  Yes [provider]  lisinopril (ZESTRIL) 20 MG tablet Take 20 mg by mouth daily. 10/04/22  Yes [provider]  Multiple Vitamins-Minerals (MULTI COMPLETE) CAPS Take 1 tablet by mouth daily. 09/17/14  Yes [provider]  Omega-3 Fatty Acids (FISH OIL) 1000 MG CAPS Take 1,000 mg by mouth daily.   Yes [provider]  Tart Cherry 1200 MG CAPS Take 1,200 mg by mouth daily.   Yes [provider]  traMADol (ULTRAM) 50 MG tablet Take 50 mg by mouth every 6 (six) hours as needed for moderate pain.   Yes [provider]     Critical care time: Elizebeth Brooking Lutisha Knoche ACNP Acute Care Nurse Practitioner [facility] Please consult Amion 10/20/2022, 11:42 AM

## 2022-10-20 NOTE — Progress Notes (Signed)
PT Cancellation Note  Patient Details Name: Louis Short MRN: [id number] DOB: [birth year]   Cancelled Treatment:    Reason Eval/Treat Not Completed: PT screened, no needs identified, will sign off - per RN pt is independent with mobility, PT to sign off.   Marye Round, PT DPT Acute Rehabilitation Services Pager [phone]  Office [phone]    Tyrone Apple E Christain [place] 10/20/2022, 1:16 PM

## 2022-10-20 NOTE — Progress Notes (Signed)
  Transition of Care [facility]) Screening Note   Patient Details  Name: Louis Short Date of Birth: 01-Apr-1971   Transition of Care [facility]) CM/SW Contact:    Harriet Masson, RN Phone Number: 10/20/2022, 9:17 AM    Transition of Care Department [facility]) has reviewed patient and no TOC needs have been identified at this time. We will continue to monitor patient advancement through interdisciplinary progression rounds. If new patient transition needs arise, please place a TOC consult.

## 2022-10-20 NOTE — Progress Notes (Signed)
SLP Cancellation Note  Patient Details Name: Louis Short MRN: [id number] DOB: 1971/03/07   Cancelled treatment:       Reason Eval/Treat Not Completed: SLP screened, no needs identified, will sign off. SLP reviewed medical chart and spoke with patient and his mother in patient's room. Patient denies having any significant GERD symptoms and reports that his main issues are sinus drainage from allergies which leads to persistent throat clearing/cough. He denies any h/o dysphagia or current dysphagia symptoms. SLP to s/o at this time but please reorder if concern for aspiration pneumonia resulting from PO intake. Thank you for this consult!  Angela Nevin, MA, CCC-SLP Speech Therapy

## 2022-10-21 ENCOUNTER — Inpatient Hospital Stay (HOSPITAL_COMMUNITY): Payer: No Typology Code available for payment source

## 2022-10-21 LAB — BRAIN NATRIURETIC PEPTIDE: B Natriuretic Peptide: 85.6 pg/mL (ref 0.0–100.0)

## 2022-10-21 LAB — CBC WITH DIFFERENTIAL/PLATELET
Abs Immature Granulocytes: 0.37 10*3/uL — ABNORMAL HIGH (ref 0.00–0.07)
Basophils Absolute: 0.1 10*3/uL (ref 0.0–0.1)
Basophils Relative: 1 %
Eosinophils Absolute: 0.2 10*3/uL (ref 0.0–0.5)
Eosinophils Relative: 2 %
HCT: 38.5 % — ABNORMAL LOW (ref 39.0–52.0)
Hemoglobin: 13.6 g/dL (ref 13.0–17.0)
Immature Granulocytes: 3 %
Lymphocytes Relative: 14 %
Lymphs Abs: 1.5 10*3/uL (ref 0.7–4.0)
MCH: 33.7 pg (ref 26.0–34.0)
MCHC: 35.3 g/dL (ref 30.0–36.0)
MCV: 95.3 fL (ref 80.0–100.0)
Monocytes Absolute: 1.1 10*3/uL — ABNORMAL HIGH (ref 0.1–1.0)
Monocytes Relative: 10 %
Neutro Abs: 7.7 10*3/uL (ref 1.7–7.7)
Neutrophils Relative %: 70 %
Platelets: 374 10*3/uL (ref 150–400)
RBC: 4.04 MIL/uL — ABNORMAL LOW (ref 4.22–5.81)
RDW: 11.6 % (ref 11.5–15.5)
WBC: 11 10*3/uL — ABNORMAL HIGH (ref 4.0–10.5)
nRBC: 0 % (ref 0.0–0.2)

## 2022-10-21 LAB — COMPREHENSIVE METABOLIC PANEL
ALT: 28 U/L (ref 0–44)
AST: 35 U/L (ref 15–41)
Albumin: 2.1 g/dL — ABNORMAL LOW (ref 3.5–5.0)
Alkaline Phosphatase: 50 U/L (ref 38–126)
Anion gap: 8 (ref 5–15)
BUN: 15 mg/dL (ref 6–20)
CO2: 24 mmol/L (ref 22–32)
Calcium: 8.6 mg/dL — ABNORMAL LOW (ref 8.9–10.3)
Chloride: 103 mmol/L (ref 98–111)
Creatinine, Ser: 0.97 mg/dL (ref 0.61–1.24)
GFR, Estimated: >60 mL/min (ref >60)
Glucose, Bld: 103 mg/dL — ABNORMAL HIGH (ref 70–99)
Potassium: 3.9 mmol/L (ref 3.5–5.1)
Sodium: 135 mmol/L (ref 135–145)
Total Bilirubin: 0.5 mg/dL (ref 0.3–1.2)
Total Protein: 6.5 g/dL (ref 6.5–8.1)

## 2022-10-21 LAB — MAGNESIUM: Magnesium: 2.1 mg/dL (ref 1.7–2.4)

## 2022-10-21 LAB — MRSA NEXT GEN BY PCR, NASAL: MRSA by PCR Next Gen: NOT DETECTED

## 2022-10-21 LAB — PROCALCITONIN: Procalcitonin: 0.27 ng/mL

## 2022-10-21 LAB — PATHOLOGIST SMEAR REVIEW

## 2022-10-21 LAB — C-REACTIVE PROTEIN: CRP: 16.8 mg/dL — ABNORMAL HIGH (ref <1.0)

## 2022-10-21 MED ORDER — PIPERACILLIN-TAZOBACTAM 3.375 G IVPB 30 MIN
3.3750 g | Freq: Once | INTRAVENOUS | Status: AC
  Administered 2022-10-21: 3.375 g via INTRAVENOUS
  Filled 2022-10-21: qty 50

## 2022-10-21 MED ORDER — PIPERACILLIN-TAZOBACTAM 3.375 G IVPB
3.3750 g | Freq: Three times a day (TID) | INTRAVENOUS | Status: DC
  Administered 2022-10-21 – 2022-10-22 (×4): 3.375 g via INTRAVENOUS
  Filled 2022-10-21 (×8): qty 50

## 2022-10-21 MED ORDER — HYDROMORPHONE HCL 1 MG/ML IJ SOLN
0.5000 mg | INTRAMUSCULAR | Status: DC | PRN
  Administered 2022-10-21: 0.5 mg via INTRAVENOUS
  Filled 2022-10-21: qty 0.5

## 2022-10-21 MED ORDER — STERILE WATER FOR INJECTION IJ SOLN
5.0000 mg | Freq: Once | RESPIRATORY_TRACT | Status: AC
  Administered 2022-10-21: 5 mg via INTRAPLEURAL
  Filled 2022-10-21: qty 5

## 2022-10-21 MED ORDER — KETOROLAC TROMETHAMINE 15 MG/ML IJ SOLN
15.0000 mg | Freq: Three times a day (TID) | INTRAMUSCULAR | Status: DC | PRN
  Administered 2022-10-21: 15 mg via INTRAVENOUS
  Filled 2022-10-21: qty 1

## 2022-10-21 MED ORDER — SODIUM CHLORIDE (PF) 0.9 % IJ SOLN
10.0000 mg | Freq: Once | INTRAMUSCULAR | Status: AC
  Administered 2022-10-21: 10 mg via INTRAPLEURAL
  Filled 2022-10-21: qty 10

## 2022-10-21 NOTE — Procedures (Signed)
Pleural Fibrinolytic Administration Procedure Note  Louis Short  [id number]  Jun 20, 1971  Date:10/21/22  Time:9:20 AM   Provider Performing:Ahni Bradwell C Katrinka Blazing   Procedure: Pleural Fibrinolysis Initial day [id number])  Indication(s) Fibrinolysis of complicated pleural effusion  Consent Risks of the procedure as well as the alternatives and risks of each were explained to the patient and/or caregiver.  Consent for the procedure was obtained.   Anesthesia None   Time Out Verified patient identification, verified procedure, site/side was marked, verified correct patient position, special equipment/implants available, medications/allergies/relevant history reviewed, required imaging and test results available.   Sterile Technique Hand hygiene, gloves   Procedure Description Existing pleural catheter was cleaned and accessed in sterile manner.  10mg  of tPA in 30cc of saline and 5mg  of dornase in 30cc of sterile water were injected into pleural space using existing pleural catheter.  Catheter will be clamped for 1 hour and then placed back to suction.   Complications/Tolerance None; patient tolerated the procedure well.  EBL None   Specimen(s) None

## 2022-10-21 NOTE — Progress Notes (Signed)
Chest tube adjusted and dumped out 700cc purulent fluid.  Now more pleurisy.  Take off suction, check CXR and see what's going on.  Pain control with dilaudid PRN and toradol  Myrla Halsted MD PCCM

## 2022-10-21 NOTE — Progress Notes (Signed)
   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  He has chronic sinus drainage postnasal drip.  He reports intermittent fevers for over 2 weeks mild shortness of breath and nonproductive cough.  He had radiographic evaluation that showed a large left effusion.  He was sent to interventional radiology for thoracentesis 10/19/2022 with 1.1 L of fluid obtained transudative in nature.  Serial chest x-ray shows reaccumulation of pleural effusion and at this time pulmonary critical care was called and asked to place pigtail catheter for further evaluation and treatment.  He is on antimicrobial therapy at this time.  We will place chest tube and continue to monitor.  There may be a role for lytics at the end of this.  Pertinent  Medical History   Past Medical History:  Diagnosis Date   Allergy    GERD (gastroesophageal reflux disease)      Significant Hospital Events: Including procedures, antibiotic start and stop dates in addition to other pertinent events   Plan for pigtail catheter of left side 11/8 Pigtail placed, TPA given 11/9 TPA repeated Cefepime>zosyn  Interim History / Subjective:  Chest tube atrium at 1050, 250 out documented by RN  Objective   Blood pressure 118/76, pulse 78, temperature 97.9 F (36.6 C), temperature source Oral, resp. rate 20, height 6' (1.829 m), weight 119.8 kg, SpO2 94 %.        Intake/Output Summary (Last 24 hours) at 10/21/2022 0911 Last data filed at 10/21/2022 6294 Gross per 24 hour  Intake 1236 ml  Output 250 ml  Net 986 ml   Filed Weights   10/19/22 0858  Weight: 119.8 kg    Examination: General: In bed, NAD, appears  comfortable HEENT: MM pink/moist, anicteric, atraumatic Neuro: RASS 0, PERRL 9mm, GCS 15 CV: S1S2, NSR, no m/r/g appreciated PULM:  air movement in  lobes, trachea midline, chest expansion symmetric, CT to -20, no al GI: soft, bsx4 active, non-tender   Extremities: warm/dry, no pretibial edema, capillary refill less than 3 seconds  Skin:  no rashes or lesions noted  CXR: Small decrease in left pleural effusion, no pneumo WBC 15.9>11.2>11 CRP 24.6>16.8 PCT 0.47>0.27  Resolved Hospital Problem list     Assessment & Plan:  Large left pleural effusion transudative in nature status post interventional radiology thoracentesis 10/29/2022 with 1.1 L obtained.  Now with recurrent larger left pleural effusion suspected empyema pulmonary critical care called to evaluate in place pigtail catheter. Pigatail CT placed by Dr. Katrinka Blazing on 11/8 with lytics given in afternoon -Lytics placed in pigtail again -Repeat CXR in AM -Cefepime>Zosyn by Dr. Katrinka Blazing. Pleural cultures pending -Pigtail   Chronic sinus congestion with obvious postnasal drip -Consider CT sinuses -ABX as above  Best Practice (right click and "Reselect all SmartList Selections" daily)   Per primary    Critical care time: n/a     Gershon Mussel., MSN, APRN, AGACNP-BC [facility]  10/21/2022 , 9:11 AM  Please see [email or web address] for pager details  If no response, please call [phone] After hours, please call Elink at [phone]

## 2022-10-21 NOTE — Progress Notes (Signed)
Pharmacy Antibiotic Note  Louis Short is a 51 y.o. male admitted on 10/18/2022, now with concern for aspiration pneumonia.  Pharmacy has been consulted for Zosyn dosing.  Plan: Zosyn 3.375g IV q8h (4 hour infusion).  Height: 6' (182.9 cm) Weight: 119.8 kg (264 lb 1.8 oz) IBW/kg (Calculated) : 77.6  Temp (24hrs), Avg:98.6 F (37 C), Min:98.6 F (37 C), Max:98.7 F (37.1 C)  Recent Labs  Lab 10/18/22 2042 10/18/22 2043 10/18/22 2317 10/19/22 0948 10/20/22 0635 10/21/22 0520  WBC 14.9*  --   --  15.9* 11.2* 11.0*  CREATININE 0.89  --   --  0.88 0.99 0.97  LATICACIDVEN  --  1.0 0.9  --   --   --     Estimated Creatinine Clearance: 119.1 mL/min (by C-G formula based on SCr of 0.97 mg/dL).    No Known Allergies   Thank you for allowing pharmacy to be a part of this patient's care.  Vernard Gambles, PharmD, BCPS  10/21/2022 7:23 AM

## 2022-10-21 NOTE — Procedures (Signed)
Pleural Fibrinolytic Administration Procedure Note  Louis Short  [id number]  1971/02/20  Date:10/21/22  Time:8:51 AM   Provider Performing:Kameran Lallier Kathie Rhodes Greggory Stallion   Procedure: Pleural Fibrinolysis Subsequent day [id number])  Indication(s) Fibrinolysis of complicated pleural effusion  Consent Risks of the procedure as well as the alternatives and risks of each were explained to the patient and/or caregiver.  Verbal consent for the procedure was obtained. Patient agreed verbally. Bedside RN Jonny Ruiz present.   Anesthesia None   Time Out Verified patient identification, verified procedure, site/side was marked, verified correct patient position, special equipment/implants available, medications/allergies/relevant history reviewed, required imaging and test results available.   Sterile Technique Hand hygiene, gloves   Procedure Description Existing pleural catheter was cleaned and accessed in sterile manner.  10mg  of tPA in 30cc of saline and 5mg  of dornase in 30cc of sterile water were injected into pleural space using existing pleural catheter.  Catheter will be clamped for 1 hour and then placed back to suction.   Complications/Tolerance None; patient tolerated the procedure well.   EBL None   Specimen(s) None   ., MSN, APRN, AGACNP-BC [facility]  10/21/2022 , 8:51 AM  Please see [email or web address] for pager details  If no response, please call [phone] After hours, please call Elink at [phone]

## 2022-10-21 NOTE — Procedures (Addendum)
Insertion of Chest Tube Procedure Note  Louis Short  [id number]  [birth year]  Date:10/21/22  Time:9:17 AM    Provider Performing: Lorin Glass   Procedure: Pleural Catheter Insertion w/ Imaging Guidance ([id number])  Indication(s) Effusion  Consent Risks of the procedure as well as the alternatives and risks of each were explained to the patient and/or caregiver.  Consent for the procedure was obtained and is signed in the bedside chart  Anesthesia Topical only with 1% lidocaine    Time Out Verified patient identification, verified procedure, site/side was marked, verified correct patient position, special equipment/implants available, medications/allergies/relevant history reviewed, required imaging and test results available.   Sterile Technique Maximal sterile technique including full sterile barrier drape, hand hygiene, sterile gown, sterile gloves, mask, hair covering, sterile ultrasound probe cover (if used).   Procedure Description Ultrasound used to identify appropriate pleural anatomy for placement and overlying skin marked. Area of placement cleaned and draped in sterile fashion.  A 14 French pigtail pleural catheter was placed into the left pleural space using Seldinger technique. Appropriate return of fluid was obtained.  The tube was connected to atrium and placed on -20 cm H2O wall suction.   Complications/Tolerance None; patient tolerated the procedure well. Chest X-ray is ordered to verify placement.   EBL Minimal  Specimen(s) none

## 2022-10-21 NOTE — Progress Notes (Signed)
PROGRESS NOTE                                                                                                                                                                                                             Patient Demographics:    Louis Short, is a 51 y.o. male, DOB - [birth year], ZOX:[id number]  Outpatient Primary MD for the patient is Alysia Penna, MD    LOS - 2  Admit date - 10/18/2022    Chief Complaint  Patient presents with   Shortness of Breath       Brief Narrative (HPI from H&P)   51 y.o. male with medical history significant of allergies and GERD who presented after being noted to have abnormal CT scan of the chest yesterday.  History is obtained from review of records as well as talks with the patient.  Currently, the patient is upset at the prolonged wait times.  He has had a 2-week history of cough and shortness of breath.   In the ER he was found to have a fever, work-up suggestive of large left-sided effusion with possible left-sided pneumonia.  He was admitted for further treatment.   Subjective:    Patient in bed, appears comfortable, denies any headache, no fever, no chest pain or pressure, no shortness of breath , no abdominal pain. No new focal weakness.    Assessment  & Plan :    Sepsis secondary to left sided exudative pleural effusion -  had upon admission, he likely had community-acquired pneumonia with parapneumonic, effusion is frankly exudative, on x-ray still has considerable residual left-sided effusion, PCCM consulted underwent left pigtail catheter placement on 10/20/2022, 1 L of fluid subsequently drained again, antibiotics have been adjusted.  Continue to monitor, if stable likely discharge home on oral Augmentin total of 2 weeks once catheter is removed by PCCM.  Essential hypertension - now stable on home regimen of ACE inhibitor and HCTZ.  As needed IV hydralazine on board as  well.      Hyponatremia and hypochloremia - due shin, resolved after IV fluids.   Anxiety/panic disorder  -Klonopin as needed   GERD  -Pepcid complete as needed for indigestion   Obesity  BMI 35.82 kg/m, follow-up with PCP.   Excessive alcohol use.  He sees he drinks way more than he should, last drink  1 week ago, counseled to quit, monitor for DTs.        Condition - Extremely Guarded  Family Communication  : None present  Code Status : Full code  Consults  : Pulmonary  PUD Prophylaxis : PPI   Procedures  :     CT -   1. No pulmonary embolus to the segmental level. The subsegmental branches are not well assessed due to contrast bolus timing. 2. Large left pleural effusion which has increased in size from prior exam. There is complete compressive atelectasis of the left lower lobe and subtotal compressive atelectasis of the left upper lobe. Effusion causes mild mass effect and rightward medial shift of the heart and mediastinal structures. 3. The right lung is clear. 4. Aortic atherosclerosis.  Coronary artery calcifications. 5. Suspected hepatic steatosis which is not well assessed due to phase of contrast. Aortic Atherosclerosis  Ultrasound-guided left pleurocentesis.  With 1 L of pleural fluid removed  Left-sided pigtail catheter placement by pulmonary critical care on 10/20/2022.      Disposition Plan  :    Status is: Inpatient  DVT Prophylaxis  :      Lab Results  Component Value Date   PLT 374 10/21/2022    Diet :  Diet Order             Diet regular Room service appropriate? Yes; Fluid consistency: Thin  Diet effective now                    Inpatient Medications  Scheduled Meds:  enoxaparin (LOVENOX) injection  0.5 mg/kg Subcutaneous Q24H   fluticasone  2 spray Each Nare Daily   hydrochlorothiazide  12.5 mg Oral q morning   lisinopril  20 mg Oral Daily   loratadine  10 mg Oral Daily   sodium chloride flush  10 mL Intrapleural Q8H    Continuous Infusions:  piperacillin-tazobactam (ZOSYN)  IV     PRN Meds:.acetaminophen **OR** acetaminophen, albuterol, calcium carbonate, clonazePAM, famotidine, hydrALAZINE, ondansetron **OR** ondansetron (ZOFRAN) IV, oxyCODONE, zolpidem     Objective:   Vitals:   10/20/22 2339 10/21/22 0000 10/21/22 0304 10/21/22 0823  BP: 118/73 119/71 117/73 118/76  Pulse: 88 85 85 78  Resp: 19 (!) 22 (!) 21 20  Temp: 98.6 F (37 C)   97.9 F (36.6 C)  TempSrc: Oral   Oral  SpO2: 93% 93% 94%   Weight:      Height:        Wt Readings from Last 3 Encounters:  10/19/22 119.8 kg  03/29/13 123.8 kg  08/25/12 122 kg     Intake/Output Summary (Last 24 hours) at 10/21/2022 1036 Last data filed at 10/21/2022 [id number] Gross per 24 hour  Intake 1136 ml  Output 250 ml  Net 886 ml     Physical Exam  Awake Alert, No new F.N deficits, Normal affect [place].AT,PERRAL Supple Neck, No JVD,   Symmetrical Chest wall movement, reduced left lower lobe breath sounds, left-sided chest tube in place RRR,No Gallops, Rubs or new Murmurs,  +ve B.Sounds, Abd Soft, No tenderness,   No Cyanosis, Clubbing or edema       Data Review:    CBC Recent Labs  Lab 10/18/22 2042 10/19/22 0948 10/20/22 0635 10/21/22 0520  WBC 14.9* 15.9* 11.2* 11.0*  HGB 14.5 13.2 13.4 13.6  HCT 41.7 37.1* 38.7* 38.5*  PLT 354 376 389 374  MCV 96.5 96.6 96.8 95.3  MCH 33.6 34.4* 33.5 33.7  MCHC  34.8 35.6 34.6 35.3  RDW 11.8 11.8 11.6 11.6  LYMPHSABS 1.0 0.9  --  1.5  MONOABS 1.5* 1.5*  --  1.1*  EOSABS 0.0 0.1  --  0.2  BASOSABS 0.1 0.1  --  0.1    Electrolytes Recent Labs  Lab 10/18/22 2042 10/18/22 2043 10/18/22 2317 10/19/22 0948 10/20/22 0635 10/21/22 0520  NA 133*  --   --  134* 136 135  K 4.2  --   --  3.8 3.7 3.9  CL 94*  --   --  96* 98 103  CO2 26  --   --  27 27 24   GLUCOSE 107*  --   --  113* 94 103*  BUN 16  --   --  14 15 15   CREATININE 0.89  --   --  0.88 0.99 0.97  CALCIUM 9.7  --   --   9.0 8.8* 8.6*  AST 21  --   --   --  28 35  ALT 14  --   --   --  21 28  ALKPHOS 67  --   --   --  58 50  BILITOT 0.9  --   --   --  0.8 0.5  ALBUMIN 3.6  --   --   --  2.1* 2.1*  MG  --   --   --   --  2.3 2.1  CRP  --   --   --   --  24.6* 16.8*  PROCALCITON  --   --   --   --  0.47 0.27  LATICACIDVEN  --  1.0 0.9  --   --   --   BNP  --   --   --   --  20.8 85.6     ID Labs Recent Labs  Lab 10/18/22 2042 10/18/22 2043 10/18/22 2317 10/19/22 0948 10/20/22 0635 10/21/22 0520  WBC 14.9*  --   --  15.9* 11.2* 11.0*  PLT 354  --   --  376 389 374  CRP  --   --   --   --  24.6* 16.8*  PROCALCITON  --   --   --   --  0.47 0.27  LATICACIDVEN  --  1.0 0.9  --   --   --   CREATININE 0.89  --   --  0.88 0.99 0.97    Radiology Reports DG CHEST PORT 1 VIEW  Result Date: 10/21/2022 CLINICAL DATA:  Pleural effusion EXAM: PORTABLE CHEST 1 VIEW COMPARISON:  Previous studies including the examination of 10/20/2022 FINDINGS: Transverse diameter of heart is increased. There is moderate to large left pleural effusion with interval decrease. Left chest tube is noted overlying the lateral aspect of left lower lung field. There are no new infiltrates in the visualized lung fields. Evaluation of left mid and left lower lung fields for infiltrates is limited by the effusion. There is no pneumothorax. IMPRESSION: There is interval decrease in left pleural effusion. There is no pneumothorax. Possibility of underlying atelectasis/pneumonia in the left mid and left lower lung fields is not excluded. Electronically Signed   By: Elmer Picker M.D.   On: 10/21/2022 08:16   DG CHEST PORT 1 VIEW  Result Date: 10/20/2022 CLINICAL DATA:  Chest tube placement EXAM: PORTABLE CHEST 1 VIEW COMPARISON:  Chest x-ray dated October 20, 2022 FINDINGS: Visualized cardiac and mediastinal contours are within normal limits. Moderate left pleural effusion is decreased in  size status post chest tube placement. No evidence  of pneumothorax. Similar mid lung atelectasis. IMPRESSION: Moderate left pleural effusion is decreased in size status post chest tube placement. No evidence of pneumothorax. Electronically Signed   By: Yetta Glassman M.D.   On: 10/20/2022 13:04   DG Chest Port 1 View  Result Date: 10/20/2022 CLINICAL DATA:  Shortness of breath. EXAM: PORTABLE CHEST 1 VIEW COMPARISON:  10/19/2022 FINDINGS: Large left pleural effusion again noted with collapse/consolidation in the visualized lower left lung. Right lung clear. Cardiopericardial silhouette is stable. Telemetry leads overlie the chest. IMPRESSION: Large left pleural effusion with collapse/consolidation in the visualized lower left lung. Electronically Signed   By: Misty Stanley M.D.   On: 10/20/2022 06:42   IR THORACENTESIS ASP PLEURAL SPACE W/IMG GUIDE  Result Date: 10/19/2022 INDICATION: Patient admitted for shortness of breath and found to have large left-sided pleural effusion. Interventional radiology asked to perform a diagnostic and therapeutic thoracentesis. EXAM: ULTRASOUND GUIDED THORACENTESIS MEDICATIONS: 1% lidocaine 15 mL COMPLICATIONS: None immediate. PROCEDURE: An ultrasound guided thoracentesis was thoroughly discussed with the patient and questions answered. The benefits, risks, alternatives and complications were also discussed. The patient understands and wishes to proceed with the procedure. Written consent was obtained. Ultrasound was performed to localize and mark an adequate pocket of fluid in the left chest. The area was then prepped and draped in the normal sterile fashion. 1% Lidocaine was used for local anesthesia. Under ultrasound guidance a 6 Fr Safe-T-Centesis catheter was introduced. Thoracentesis was performed. The catheter was removed and a dressing applied. FINDINGS: A total of approximately 1.1 L of dark yellow fluid was removed. Samples were sent to the laboratory as requested by the clinical team. IMPRESSION: Successful  ultrasound guided left thoracentesis yielding 1.1 L of pleural fluid. Read by: Soyla Dryer, NP Electronically Signed   By: Jacqulynn Cadet M.D.   On: 10/19/2022 12:48   DG Chest 1 View  Result Date: 10/19/2022 CLINICAL DATA:  Status post left thoracentesis with removal of 1.1 L EXAM: CHEST  1 VIEW COMPARISON:  CT angio chest 10/18/2022 FINDINGS: The cardiac silhouette is obscured by the large left effusion. Decreased volume of left pleural effusion status post thoracentesis. Improved aeration to the left upper lobe. No signs of pneumothorax. Right lung appears clear. IMPRESSION: 1. No pneumothorax after left thoracentesis. Electronically Signed   By: Kerby Moors M.D.   On: 10/19/2022 11:14   CT Angio Chest Pulmonary Embolism (PE) W or WO Contrast  Result Date: 10/18/2022 CLINICAL DATA:  Pleural effusion. EXAM: CT ANGIOGRAPHY CHEST WITH CONTRAST TECHNIQUE: Multidetector CT imaging of the chest was performed using the standard protocol during bolus administration of intravenous contrast. Multiplanar CT image reconstructions and MIPs were obtained to evaluate the vascular anatomy. RADIATION DOSE REDUCTION: This exam was performed according to the departmental dose-optimization program which includes automated exposure control, adjustment of the mA and/or kV according to patient size and/or use of iterative reconstruction technique. CONTRAST:  28mL OMNIPAQUE IOHEXOL 350 MG/ML SOLN COMPARISON:  Rib radiographs 10/13/2022 FINDINGS: Cardiovascular: There is no pulmonary embolus to the segmental level. The subsegmental branches are not well assessed due to contrast bolus timing. The left pulmonary arteries are further limited due to compression related to atelectasis. Heart is normal in size. There is slight rightward displacement of the heart and mediastinal structures due to large left pleural effusion. There are coronary artery calcifications. No pericardial effusion. The thoracic aorta is normal in  caliber with mild atherosclerosis. No dissection.  Mediastinum/Nodes: Scattered mediastinal lymph nodes, largest in the anterior paratracheal station measuring 9 mm. No enlarged lymph nodes by size criteria. Left hilar assessment is limited due to adjacent pleural fluid and atelectasis. There is no obvious bulky adenopathy. The esophagus is decompressed. Lungs/Pleura: Large left pleural effusion which has increased in size from prior exam. Pleural fluid measures simple fluid density. There is no pleural enhancement. Effusion is partially loculated. There is complete compressive atelectasis of the left lower lobe and subtotal compressive atelectasis of the left upper lobe. Allowing for motion artifact the right lung is clear. There is no right pleural effusion. The trachea and central airways are patent. No pulmonary mass or nodule in the aerated lung, left lung assessment is limited due to compressive atelectasis. Upper Abdomen: No acute upper abdominal findings. Suspected hepatic steatosis which is not well assessed due to phase of contrast. Musculoskeletal: Faint sclerotic density within the left lateral sixth rib which has nonaggressive features. No suspicious bone lesion or rib fracture. Review of the MIP images confirms the above findings. IMPRESSION: 1. No pulmonary embolus to the segmental level. The subsegmental branches are not well assessed due to contrast bolus timing. 2. Large left pleural effusion which has increased in size from prior exam. There is complete compressive atelectasis of the left lower lobe and subtotal compressive atelectasis of the left upper lobe. Effusion causes mild mass effect and rightward medial shift of the heart and mediastinal structures. 3. The right lung is clear. 4. Aortic atherosclerosis.  Coronary artery calcifications. 5. Suspected hepatic steatosis which is not well assessed due to phase of contrast. Aortic Atherosclerosis (ICD10-I70.0). These results were called by  telephone at the time of interpretation on 10/18/2022 at 7:28 pm to provider Virgina Jock , who verbally acknowledged these results. Given the size of the effusion that has increased, the patient was referred to the emergency room for further evaluation and treatment. This information was conveyed to the covering technologist to direct the patient to the ER. Electronically Signed   By: Keith Rake M.D.   On: 10/18/2022 19:29      Signature  Lala Lund M.D on 10/21/2022 at 10:36 AM   -  To page go to [email or web address]

## 2022-10-22 ENCOUNTER — Other Ambulatory Visit (HOSPITAL_COMMUNITY): Payer: Self-pay

## 2022-10-22 ENCOUNTER — Telehealth: Payer: Self-pay | Admitting: Internal Medicine

## 2022-10-22 ENCOUNTER — Inpatient Hospital Stay (HOSPITAL_COMMUNITY): Payer: No Typology Code available for payment source

## 2022-10-22 DIAGNOSIS — J90 Pleural effusion, not elsewhere classified: Secondary | ICD-10-CM

## 2022-10-22 LAB — CBC WITH DIFFERENTIAL/PLATELET
Abs Immature Granulocytes: 0.27 10*3/uL — ABNORMAL HIGH (ref 0.00–0.07)
Basophils Absolute: 0.1 10*3/uL (ref 0.0–0.1)
Basophils Relative: 0 %
Eosinophils Absolute: 0.2 10*3/uL (ref 0.0–0.5)
Eosinophils Relative: 1 %
HCT: 40.6 % (ref 39.0–52.0)
Hemoglobin: 14.2 g/dL (ref 13.0–17.0)
Immature Granulocytes: 2 %
Lymphocytes Relative: 15 %
Lymphs Abs: 1.7 10*3/uL (ref 0.7–4.0)
MCH: 33.6 pg (ref 26.0–34.0)
MCHC: 35 g/dL (ref 30.0–36.0)
MCV: 96 fL (ref 80.0–100.0)
Monocytes Absolute: 1.1 10*3/uL — ABNORMAL HIGH (ref 0.1–1.0)
Monocytes Relative: 10 %
Neutro Abs: 8 10*3/uL — ABNORMAL HIGH (ref 1.7–7.7)
Neutrophils Relative %: 72 %
Platelets: 419 10*3/uL — ABNORMAL HIGH (ref 150–400)
RBC: 4.23 MIL/uL (ref 4.22–5.81)
RDW: 11.7 % (ref 11.5–15.5)
WBC: 11.3 10*3/uL — ABNORMAL HIGH (ref 4.0–10.5)
nRBC: 0 % (ref 0.0–0.2)

## 2022-10-22 LAB — C-REACTIVE PROTEIN: CRP: 14.9 mg/dL — ABNORMAL HIGH (ref <1.0)

## 2022-10-22 LAB — COMPREHENSIVE METABOLIC PANEL
ALT: 24 U/L (ref 0–44)
AST: 26 U/L (ref 15–41)
Albumin: 2.1 g/dL — ABNORMAL LOW (ref 3.5–5.0)
Alkaline Phosphatase: 55 U/L (ref 38–126)
Anion gap: 9 (ref 5–15)
BUN: 12 mg/dL (ref 6–20)
CO2: 26 mmol/L (ref 22–32)
Calcium: 8.8 mg/dL — ABNORMAL LOW (ref 8.9–10.3)
Chloride: 98 mmol/L (ref 98–111)
Creatinine, Ser: 1.06 mg/dL (ref 0.61–1.24)
GFR, Estimated: >60 mL/min (ref >60)
Glucose, Bld: 105 mg/dL — ABNORMAL HIGH (ref 70–99)
Potassium: 4.4 mmol/L (ref 3.5–5.1)
Sodium: 133 mmol/L — ABNORMAL LOW (ref 135–145)
Total Bilirubin: 0.7 mg/dL (ref 0.3–1.2)
Total Protein: 6.7 g/dL (ref 6.5–8.1)

## 2022-10-22 LAB — PROCALCITONIN: Procalcitonin: 0.47 ng/mL

## 2022-10-22 LAB — BRAIN NATRIURETIC PEPTIDE: B Natriuretic Peptide: 14.6 pg/mL (ref 0.0–100.0)

## 2022-10-22 LAB — MAGNESIUM: Magnesium: 2.3 mg/dL (ref 1.7–2.4)

## 2022-10-22 MED ORDER — AMOXICILLIN-POT CLAVULANATE 875-125 MG PO TABS
1.0000 | ORAL_TABLET | Freq: Two times a day (BID) | ORAL | 0 refills | Status: DC
  Filled 2022-10-22: qty 28, 14d supply, fill #0

## 2022-10-22 NOTE — Telephone Encounter (Signed)
Pt called back and an appt was scheduled for HFU. Nothing further needed.

## 2022-10-22 NOTE — Progress Notes (Signed)
[name] to be D/C'd Home per MD order.  Discussed with the patient and all questions fully answered.  VSS, Skin clean, dry and intact without evidence of skin break down, no evidence of skin tears noted. IV catheter discontinued intact. Site without signs and symptoms of complications. Dressing and pressure applied.  An After Visit Summary was printed and given to the patient. Patient received prescription.  D/c education completed with patient/family including follow up instructions, medication list, d/c activities limitations if indicated, with other d/c instructions as indicated by MD - patient able to verbalize understanding, all questions fully answered.   Patient instructed to return to ED, call 911, or call MD for any changes in condition.   Patient escorted via WC, and D/C home via private auto.  Louis Short 10/22/2022 7:25 PM

## 2022-10-22 NOTE — Progress Notes (Signed)
Removed Chest Tube per orders.  Patient tolerated well

## 2022-10-22 NOTE — Discharge Instructions (Signed)
Follow with Primary MD Alysia Penna, MD in 7 days   Get CBC, CMP, 2 view Chest X ray -  checked next visit with your primary MD    Activity: As tolerated with Full fall precautions use walker/cane & assistance as needed  Disposition Home   Diet: Heart Healthy    Special Instructions: If you have smoked or chewed Tobacco  in the last 2 yrs please stop smoking, stop any regular Alcohol  and or any Recreational drug use.  On your next visit with your primary care physician please Get Medicines reviewed and adjusted.  Please request your Prim.MD to go over all Hospital Tests and Procedure/Radiological results at the follow up, please get all Hospital records sent to your Prim MD by signing hospital release before you go home.  If you experience worsening of your admission symptoms, develop shortness of breath, life threatening emergency, suicidal or homicidal thoughts you must seek medical attention immediately by calling 911 or calling your MD immediately  if symptoms less severe.  You Must read complete instructions/literature along with all the possible adverse reactions/side effects for all the Medicines you take and that have been prescribed to you. Take any new Medicines after you have completely understood and accpet all the possible adverse reactions/side effects.

## 2022-10-22 NOTE — TOC Transition Note (Signed)
Transition of Care [facility]) - CM/SW Discharge Note   Patient Details  Name: Louis Short MRN: [id number] Date of Birth: 1971-07-24  [facility]) CM/SW Contact:  Lockie Pares, RN Phone Number: 10/22/2022, 1:37 PM   Clinical Narrative:    Patient is discharging today, no needs identified.         Patient Goals and CMS Choice        Discharge Placement                       Discharge Plan and Services                                     Social Determinants of Health (SDOH) Interventions     Readmission Risk Interventions     No data to display

## 2022-10-22 NOTE — Progress Notes (Signed)
   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  He has chronic sinus drainage postnasal drip.  He reports intermittent fevers for over 2 weeks mild shortness of breath and nonproductive cough.  He had radiographic evaluation that showed a large left effusion.  He was sent to interventional radiology for thoracentesis 10/19/2022 with 1.1 L of fluid obtained transudative in nature.  Serial chest x-ray shows reaccumulation of pleural effusion and at this time pulmonary critical care was called and asked to place pigtail catheter for further evaluation and treatment.  He is on antimicrobial therapy at this time.  We will place chest tube and continue to monitor.  There may be a role for lytics at the end of this.  Pertinent  Medical History   Past Medical History:  Diagnosis Date   Allergy    GERD (gastroesophageal reflux disease)      Significant Hospital Events: Including procedures, antibiotic start and stop dates in addition to other pertinent events   Plan for pigtail catheter of left side 11/8 Pigtail placed, TPA given 11/9 TPA repeated [id number]>[id number] 10/22/2022 chest tube removed  Interim History / Subjective:  No further drainage from chest tube therefore we will remove   Objective   Blood pressure 111/69, pulse 75, temperature (!) 97.3 F (36.3 C), temperature source Oral, resp. rate 20, height 6' (1.829 m), weight 119.8 kg, SpO2 92 %.        Intake/Output Summary (Last 24 hours) at 10/22/2022 0846 Last data filed at 10/21/2022 1700 Gross per 24 hour  Intake 480 ml  Output 1100 ml  Net -620 ml   Filed Weights   10/19/22 0858  Weight: 119.8 kg     Examination: General: Well-nourished well-developed male no acute distress HEENT: MM pink/moist no JVD Neuro: Intact CV: Heart sounds are regular PULM: Diminished left base GI: soft, bsx4 active  GU: Voids Extremities: warm/dry, negative edema  Skin: no rashes or lesions   Resolved Hospital Problem list     Assessment & Plan:  Large left pleural effusion transudative in nature status post interventional radiology thoracentesis 10/29/2022 with 1.1 L obtained.  Now with recurrent larger left pleural effusion suspected empyema pulmonary critical care called to evaluate in place pigtail catheter. Pigatail CT placed by Dr. Katrinka Blazing on 11/8 with lytics given in afternoon Total of 3000 cc of left chest tube drainage.  No longer draining at this time.  Chest tube flushes easily. We will therefore DC chest tube at this time. Follow-up chest x-ray  Chronic sinus congestion with obvious postnasal drip -Consider CT sinuses -ABX as above  Best Practice (right click and "Reselect all SmartList Selections" daily)   Per primary    Critical care time: n/a     Brett Canales Clent Damore ACNP Acute Care Nurse Practitioner [facility] Pulmonary/Critical Care Please consult Amion 10/22/2022, 8:46 AM

## 2022-10-22 NOTE — Progress Notes (Signed)
PROGRESS NOTE                                                                                                                                                                                                             Patient Demographics:    Louis Short, is a 51 y.o. male, DOB - 1971/09/30, JWJ:[id number]  Outpatient Primary MD for the patient is Alysia PennaHolwerda, Scott, MD    LOS - 3  Admit date - 10/18/2022    Chief Complaint  Patient presents with   Shortness of Breath       Brief Narrative (HPI from H&P)   51 y.o. male with medical history significant of allergies and GERD who presented after being noted to have abnormal CT scan of the chest yesterday.  History is obtained from review of records as well as talks with the patient.  Currently, the patient is upset at the prolonged wait times.  He has had a 2-week history of cough and shortness of breath.   In the ER he was found to have a fever, work-up suggestive of large left-sided effusion with possible left-sided pneumonia.  He was admitted for further treatment.   Subjective:   Patient in bed, appears comfortable, denies any headache, no fever, no chest pain or pressure, no shortness of breath , no abdominal pain. No focal weakness.   Assessment  & Plan :    Sepsis secondary to left sided exudative pleural effusion -  had upon admission, he likely had community-acquired pneumonia with parapneumonic, effusion is frankly exudative, on x-ray still has considerable residual left-sided effusion, PCCM consulted underwent left pigtail catheter placement on 10/20/2022, 1 L of fluid subsequently drained again, antibiotics have been adjusted.  Catheter likely to be removed later on 10/22/2022 with 24-hour monitoring, if stable likely discharge home on oral Augmentin total of 2 weeks once catheter is removed by PCCM.  Essential hypertension - now stable on home regimen of ACE inhibitor  and HCTZ.  As needed IV hydralazine on board as well.      Hyponatremia and hypochloremia - due shin, resolved after IV fluids.   Anxiety/panic disorder  -Klonopin as needed   GERD  -Pepcid complete as needed for indigestion   Obesity  BMI 35.82 kg/m, follow-up with PCP.   Excessive alcohol use.  He sees he drinks way more  than he should, last drink 1 week ago, counseled to quit, monitor for DTs.        Condition - Extremely Guarded  Family Communication  : None present  Code Status : Full code  Consults  : Pulmonary  PUD Prophylaxis : PPI   Procedures  :     CT -   1. No pulmonary embolus to the segmental level. The subsegmental branches are not well assessed due to contrast bolus timing. 2. Large left pleural effusion which has increased in size from prior exam. There is complete compressive atelectasis of the left lower lobe and subtotal compressive atelectasis of the left upper lobe. Effusion causes mild mass effect and rightward medial shift of the heart and mediastinal structures. 3. The right lung is clear. 4. Aortic atherosclerosis.  Coronary artery calcifications. 5. Suspected hepatic steatosis which is not well assessed due to phase of contrast. Aortic Atherosclerosis  Ultrasound-guided left pleurocentesis.  With 1 L of pleural fluid removed  Left-sided pigtail catheter placement by pulmonary critical care on 10/20/2022.      Disposition Plan  :    Status is: Inpatient  DVT Prophylaxis  :      Lab Results  Component Value Date   PLT 419 (H) 10/22/2022    Diet :  Diet Order             Diet regular Room service appropriate? Yes; Fluid consistency: Thin  Diet effective now                    Inpatient Medications  Scheduled Meds:  enoxaparin (LOVENOX) injection  0.5 mg/kg Subcutaneous Q24H   fluticasone  2 spray Each Nare Daily   hydrochlorothiazide  12.5 mg Oral q morning   lisinopril  20 mg Oral Daily   loratadine  10 mg Oral Daily    sodium chloride flush  10 mL Intrapleural Q8H   Continuous Infusions:  piperacillin-tazobactam (ZOSYN)  IV 3.375 g (10/22/22 0529)   PRN Meds:.acetaminophen **OR** acetaminophen, albuterol, calcium carbonate, clonazePAM, famotidine, hydrALAZINE, HYDROmorphone (DILAUDID) injection, ketorolac, ondansetron **OR** ondansetron (ZOFRAN) IV, oxyCODONE, zolpidem     Objective:   Vitals:   10/22/22 0400 10/22/22 0500 10/22/22 0600 10/22/22 0813  BP: 119/71   111/69  Pulse: 69   75  Resp: 20 (!) 44 (!) 22 20  Temp: 98 F (36.7 C)   (!) 97.3 F (36.3 C)  TempSrc: Oral   Oral  SpO2: 92%     Weight:      Height:        Wt Readings from Last 3 Encounters:  10/19/22 119.8 kg  03/29/13 123.8 kg  08/25/12 122 kg     Intake/Output Summary (Last 24 hours) at 10/22/2022 1005 Last data filed at 10/21/2022 1700 Gross per 24 hour  Intake 480 ml  Output 1100 ml  Net -620 ml     Physical Exam  Awake Alert, No new F.N deficits, Normal affect [place].AT,PERRAL Supple Neck, No JVD,   Symmetrical Chest wall movement, reduced left lower lobe breath sounds, left-sided chest tube in place RRR,No Gallops, Rubs or new Murmurs,  +ve B.Sounds, Abd Soft, No tenderness,   No Cyanosis, Clubbing or edema       Data Review:    CBC Recent Labs  Lab 10/18/22 2042 10/19/22 0948 10/20/22 0635 10/21/22 0520 10/22/22 0240  WBC 14.9* 15.9* 11.2* 11.0* 11.3*  HGB 14.5 13.2 13.4 13.6 14.2  HCT 41.7 37.1* 38.7* 38.5* 40.6  PLT  354 376 389 374 419*  MCV 96.5 96.6 96.8 95.3 96.0  MCH 33.6 34.4* 33.5 33.7 33.6  MCHC 34.8 35.6 34.6 35.3 35.0  RDW 11.8 11.8 11.6 11.6 11.7  LYMPHSABS 1.0 0.9  --  1.5 1.7  MONOABS 1.5* 1.5*  --  1.1* 1.1*  EOSABS 0.0 0.1  --  0.2 0.2  BASOSABS 0.1 0.1  --  0.1 0.1    Electrolytes Recent Labs  Lab 10/18/22 2042 10/18/22 2043 10/18/22 2317 10/19/22 0948 10/20/22 0635 10/21/22 0520 10/22/22 0240  NA 133*  --   --  134* 136 135 133*  K 4.2  --   --  3.8 3.7 3.9  4.4  CL 94*  --   --  96* 98 103 98  CO2 26  --   --  27 27 24 26   GLUCOSE 107*  --   --  113* 94 103* 105*  BUN 16  --   --  14 15 15 12   CREATININE 0.89  --   --  0.88 0.99 0.97 1.06  CALCIUM 9.7  --   --  9.0 8.8* 8.6* 8.8*  AST 21  --   --   --  28 35 26  ALT 14  --   --   --  21 28 24   ALKPHOS 67  --   --   --  58 50 55  BILITOT 0.9  --   --   --  0.8 0.5 0.7  ALBUMIN 3.6  --   --   --  2.1* 2.1* 2.1*  MG  --   --   --   --  2.3 2.1 2.3  CRP  --   --   --   --  24.6* 16.8* 14.9*  PROCALCITON  --   --   --   --  0.47 0.27 0.47  LATICACIDVEN  --  1.0 0.9  --   --   --   --   BNP  --   --   --   --  20.8 85.6 14.6     ID Labs Recent Labs  Lab 10/18/22 2042 10/18/22 2043 10/18/22 2317 10/19/22 0948 10/20/22 0635 10/21/22 0520 10/22/22 0240  WBC 14.9*  --   --  15.9* 11.2* 11.0* 11.3*  PLT 354  --   --  376 389 374 419*  CRP  --   --   --   --  24.6* 16.8* 14.9*  PROCALCITON  --   --   --   --  0.47 0.27 0.47  LATICACIDVEN  --  1.0 0.9  --   --   --   --   CREATININE 0.89  --   --  0.88 0.99 0.97 1.06    Radiology Reports DG CHEST PORT 1 VIEW  Result Date: 10/22/2022 CLINICAL DATA:  Follow-up chest tube. Evaluate left pleural effusion. EXAM: PORTABLE CHEST 1 VIEW COMPARISON:  CT and x-ray from 10/21/2022 FINDINGS: There is a left-sided pigtail thoracostomy tube within the periphery of the left lower chest. Unchanged appearance of small loculated left hydropneumothorax with pleural thickening overlying the left upper and left lower lobe. Atelectasis within the basilar left upper lobe and left lower lobe is unchanged. Right lung appears clear. IMPRESSION: 1. No change in appearance of small loculated left hydropneumothorax. 2. Stable atelectasis within the basilar left upper lobe and left lower lobe. Electronically Signed   By: 13/10/23 M.D.   On: 10/22/2022 07:02  CT CHEST WO CONTRAST  Result Date: 10/21/2022 CLINICAL DATA:  Pneumonia, complication suspected, xray  done Shortness of breath. EXAM: CT CHEST WITHOUT CONTRAST TECHNIQUE: Multidetector CT imaging of the chest was performed following the standard protocol without IV contrast. RADIATION DOSE REDUCTION: This exam was performed according to the departmental dose-optimization program which includes automated exposure control, adjustment of the mA and/or kV according to patient size and/or use of iterative reconstruction technique. COMPARISON:  Recent radiographs. CTA 10/18/2022 FINDINGS: Cardiovascular: The thoracic aorta is normal in caliber. The heart is normal in size. Again seen coronary artery calcifications. No pericardial effusion. Mediastinum/Nodes: Scattered small mediastinal and prevascular nodes, not enlarged by size criteria. Assessment for hilar adenopathy is limited on this unenhanced exam. No visible thyroid nodule. Decompressed esophagus. Lungs/Pleura: Left pigtail catheter is coiled in the anterior left lung base. Diminished size of left pleural effusion with small volume of pleural fluid persisting. Small amount of fluid tracks into the left inter lobar fissure. Small amount of extrapleural gas is likely related to procedure. Re-expansion of the left lung. There are bandlike and ill-defined opacities throughout the left hemithorax. Slightly more confluent opacity in the dependent left lower lobe with air bronchograms. There is no obvious pulmonary mass. Minor dependent atelectasis in the right lower lobe, right lung is otherwise clear. The trachea and central airways are patent. Upper Abdomen: No acute findings. Musculoskeletal: Thoracic spondylosis with spurring. Bilateral glenohumeral osteoarthritis. No suspicious bone lesion. IMPRESSION: 1. Left pigtail catheter is coiled in the anterior left lung base. Diminished size of left pleural effusion with small volume of pleural fluid persisting. Small amount of extrapleural gas is likely related to procedure. 2. Re-expansion of the left lung with bandlike  and ill-defined opacities throughout the left hemithorax. Slightly more confluent opacity in the dependent left lower lobe with air bronchograms. Lung base findings suspicious for pneumonia, additional opacities typical of atelectasis. 3. Coronary artery calcifications. Electronically Signed   By: Narda Rutherford M.D.   On: 10/21/2022 18:37   DG Chest Port 1 View  Result Date: 10/21/2022 CLINICAL DATA:  Hypoxia EXAM: PORTABLE CHEST 1 VIEW COMPARISON:  Chest x-ray dated October 21, 2022 FINDINGS: Visualized cardiac and mediastinal contours are unchanged. Small left pleural effusion with associated atelectasis, decreased in size when compared with prior exam. Left-sided chest tube in place. No evidence of pneumothorax. IMPRESSION: Small left pleural effusion, decreased in size when compared with prior exam. Electronically Signed   By: Allegra Lai M.D.   On: 10/21/2022 13:27   DG CHEST PORT 1 VIEW  Result Date: 10/21/2022 CLINICAL DATA:  Pleural effusion EXAM: PORTABLE CHEST 1 VIEW COMPARISON:  Previous studies including the examination of 10/20/2022 FINDINGS: Transverse diameter of heart is increased. There is moderate to large left pleural effusion with interval decrease. Left chest tube is noted overlying the lateral aspect of left lower lung field. There are no new infiltrates in the visualized lung fields. Evaluation of left mid and left lower lung fields for infiltrates is limited by the effusion. There is no pneumothorax. IMPRESSION: There is interval decrease in left pleural effusion. There is no pneumothorax. Possibility of underlying atelectasis/pneumonia in the left mid and left lower lung fields is not excluded. Electronically Signed   By: Ernie Avena M.D.   On: 10/21/2022 08:16   DG CHEST PORT 1 VIEW  Result Date: 10/20/2022 CLINICAL DATA:  Chest tube placement EXAM: PORTABLE CHEST 1 VIEW COMPARISON:  Chest x-ray dated October 20, 2022 FINDINGS: Visualized cardiac and mediastinal  contours are within normal limits. Moderate left pleural effusion is decreased in size status post chest tube placement. No evidence of pneumothorax. Similar mid lung atelectasis. IMPRESSION: Moderate left pleural effusion is decreased in size status post chest tube placement. No evidence of pneumothorax. Electronically Signed   By: Allegra Lai M.D.   On: 10/20/2022 13:04   DG Chest Port 1 View  Result Date: 10/20/2022 CLINICAL DATA:  Shortness of breath. EXAM: PORTABLE CHEST 1 VIEW COMPARISON:  10/19/2022 FINDINGS: Large left pleural effusion again noted with collapse/consolidation in the visualized lower left lung. Right lung clear. Cardiopericardial silhouette is stable. Telemetry leads overlie the chest. IMPRESSION: Large left pleural effusion with collapse/consolidation in the visualized lower left lung. Electronically Signed   By: Kennith Center M.D.   On: 10/20/2022 06:42   IR THORACENTESIS ASP PLEURAL SPACE W/IMG GUIDE  Result Date: 10/19/2022 INDICATION: Patient admitted for shortness of breath and found to have large left-sided pleural effusion. Interventional radiology asked to perform a diagnostic and therapeutic thoracentesis. EXAM: ULTRASOUND GUIDED THORACENTESIS MEDICATIONS: 1% lidocaine 15 mL COMPLICATIONS: None immediate. PROCEDURE: An ultrasound guided thoracentesis was thoroughly discussed with the patient and questions answered. The benefits, risks, alternatives and complications were also discussed. The patient understands and wishes to proceed with the procedure. Written consent was obtained. Ultrasound was performed to localize and mark an adequate pocket of fluid in the left chest. The area was then prepped and draped in the normal sterile fashion. 1% Lidocaine was used for local anesthesia. Under ultrasound guidance a 6 Fr Safe-T-Centesis catheter was introduced. Thoracentesis was performed. The catheter was removed and a dressing applied. FINDINGS: A total of approximately 1.1 L  of dark yellow fluid was removed. Samples were sent to the laboratory as requested by the clinical team. IMPRESSION: Successful ultrasound guided left thoracentesis yielding 1.1 L of pleural fluid. Read by: Alwyn Ren, NP Electronically Signed   By: Malachy Moan M.D.   On: 10/19/2022 12:48   DG Chest 1 View  Result Date: 10/19/2022 CLINICAL DATA:  Status post left thoracentesis with removal of 1.1 L EXAM: CHEST  1 VIEW COMPARISON:  CT angio chest 10/18/2022 FINDINGS: The cardiac silhouette is obscured by the large left effusion. Decreased volume of left pleural effusion status post thoracentesis. Improved aeration to the left upper lobe. No signs of pneumothorax. Right lung appears clear. IMPRESSION: 1. No pneumothorax after left thoracentesis. Electronically Signed   By: Signa Kell M.D.   On: 10/19/2022 11:14   CT Angio Chest Pulmonary Embolism (PE) W or WO Contrast  Result Date: 10/18/2022 CLINICAL DATA:  Pleural effusion. EXAM: CT ANGIOGRAPHY CHEST WITH CONTRAST TECHNIQUE: Multidetector CT imaging of the chest was performed using the standard protocol during bolus administration of intravenous contrast. Multiplanar CT image reconstructions and MIPs were obtained to evaluate the vascular anatomy. RADIATION DOSE REDUCTION: This exam was performed according to the departmental dose-optimization program which includes automated exposure control, adjustment of the mA and/or kV according to patient size and/or use of iterative reconstruction technique. CONTRAST:  75mL OMNIPAQUE IOHEXOL 350 MG/ML SOLN COMPARISON:  Rib radiographs 10/13/2022 FINDINGS: Cardiovascular: There is no pulmonary embolus to the segmental level. The subsegmental branches are not well assessed due to contrast bolus timing. The left pulmonary arteries are further limited due to compression related to atelectasis. Heart is normal in size. There is slight rightward displacement of the heart and mediastinal structures due to  large left pleural effusion. There are coronary artery calcifications. No pericardial effusion. The  thoracic aorta is normal in caliber with mild atherosclerosis. No dissection. Mediastinum/Nodes: Scattered mediastinal lymph nodes, largest in the anterior paratracheal station measuring 9 mm. No enlarged lymph nodes by size criteria. Left hilar assessment is limited due to adjacent pleural fluid and atelectasis. There is no obvious bulky adenopathy. The esophagus is decompressed. Lungs/Pleura: Large left pleural effusion which has increased in size from prior exam. Pleural fluid measures simple fluid density. There is no pleural enhancement. Effusion is partially loculated. There is complete compressive atelectasis of the left lower lobe and subtotal compressive atelectasis of the left upper lobe. Allowing for motion artifact the right lung is clear. There is no right pleural effusion. The trachea and central airways are patent. No pulmonary mass or nodule in the aerated lung, left lung assessment is limited due to compressive atelectasis. Upper Abdomen: No acute upper abdominal findings. Suspected hepatic steatosis which is not well assessed due to phase of contrast. Musculoskeletal: Faint sclerotic density within the left lateral sixth rib which has nonaggressive features. No suspicious bone lesion or rib fracture. Review of the MIP images confirms the above findings. IMPRESSION: 1. No pulmonary embolus to the segmental level. The subsegmental branches are not well assessed due to contrast bolus timing. 2. Large left pleural effusion which has increased in size from prior exam. There is complete compressive atelectasis of the left lower lobe and subtotal compressive atelectasis of the left upper lobe. Effusion causes mild mass effect and rightward medial shift of the heart and mediastinal structures. 3. The right lung is clear. 4. Aortic atherosclerosis.  Coronary artery calcifications. 5. Suspected hepatic  steatosis which is not well assessed due to phase of contrast. Aortic Atherosclerosis (ICD10-I70.0). These results were called by telephone at the time of interpretation on 10/18/2022 at 7:28 pm to provider Timothy Lasso , who verbally acknowledged these results. Given the size of the effusion that has increased, the patient was referred to the emergency room for further evaluation and treatment. This information was conveyed to the covering technologist to direct the patient to the ER. Electronically Signed   By: Narda Rutherford M.D.   On: 10/18/2022 19:29      Signature  Susa Raring M.D on 10/22/2022 at 10:05 AM   -  To page go to [email or web address]

## 2022-10-22 NOTE — Discharge Summary (Signed)
Louis Short CVE:[id number] DOB: 11/29/1971 DOA: 10/18/2022  PCP: Alysia Penna, MD  Admit date: 10/18/2022  Discharge date: 10/22/2022  Admitted From: Home   Disposition:  Home   Recommendations for Outpatient Follow-up:   Follow up with PCP in 1-2 weeks  PCP Please obtain BMP/CBC, 2 view CXR in 1week,  (see Discharge instructions)   PCP Please follow up on the following pending results:    Home Health: None   Equipment/Devices: None  Consultations: IR, PCCm Discharge Condition: Stable    CODE STATUS: Full    Diet Recommendation: Heart Healthy   Diet Order             Diet - low sodium heart healthy           Diet regular Room service appropriate? Yes; Fluid consistency: Thin  Diet effective now                    Chief Complaint  Patient presents with   Shortness of Breath     Brief history of present illness from the day of admission and additional interim summary    51 y.o. male with medical history significant of allergies and GERD who presented after being noted to have abnormal CT scan of the chest yesterday.  History is obtained from review of records as well as talks with the patient.  Currently, the patient is upset at the prolonged wait times.  He has had a 2-week history of cough and shortness of breath.   In the ER he was found to have a fever, work-up suggestive of large left-sided effusion with possible left-sided pneumonia.  He was admitted for further treatment.                                                                    Hospital Course        Sepsis secondary to left sided exudative pleural effusion -  had upon admission, he likely had community-acquired pneumonia with parapneumonic, effusion is frankly exudative, on x-ray still has considerable residual left-sided  effusion, PCCM consulted underwent left pigtail catheter placement on 10/20/2022, 1 L of fluid subsequently drained again, antibiotics have been adjusted.  Catheter moved today post removal x-ray stable per PCCM discharge on 2 weeks of oral Augmentin with outpatient follow-up with PCP and PCCM.  Patient currently symptom-free.  Cultures have remained negative thus far.   Essential hypertension - now stable on home regimen of ACE inhibitor and HCTZ.  PCP to monitor and adjust.   Hyponatremia and hypochloremia -due to dehydration resolved after IV fluids.   Anxiety/panic disorder  -Klonopin as needed   GERD  - Pepcid    Obesity  BMI 35.82 kg/m, follow-up with PCP.   Excessive alcohol use.  He sees he  drinks way more than he should, last drink 1 week ago, counseled to quit, no signs of DTs.     Discharge diagnosis     Principal Problem:   Exudative pleural effusion Active Problems:   Sepsis (HCC)   Essential hypertension   Hyponatremia   Hypochloremia   PANIC DISORDER   GERD   Obesity (BMI 30-39.9)    Discharge instructions    Discharge Instructions     Diet - low sodium heart healthy   Complete by: As directed    Discharge instructions   Complete by: As directed    Follow with Primary MD Alysia Penna, MD in 7 days   Get CBC, CMP, 2 view Chest X ray -  checked next visit with your primary MD    Activity: As tolerated with Full fall precautions use walker/cane & assistance as needed  Disposition Home   Diet: Heart Healthy    Special Instructions: If you have smoked or chewed Tobacco  in the last 2 yrs please stop smoking, stop any regular Alcohol  and or any Recreational drug use.  On your next visit with your primary care physician please Get Medicines reviewed and adjusted.  Please request your Prim.MD to go over all Hospital Tests and Procedure/Radiological results at the follow up, please get all Hospital records sent to your Prim MD by signing hospital  release before you go home.  If you experience worsening of your admission symptoms, develop shortness of breath, life threatening emergency, suicidal or homicidal thoughts you must seek medical attention immediately by calling 911 or calling your MD immediately  if symptoms less severe.  You Must read complete instructions/literature along with all the possible adverse reactions/side effects for all the Medicines you take and that have been prescribed to you. Take any new Medicines after you have completely understood and accpet all the possible adverse reactions/side effects.   Increase activity slowly   Complete by: As directed    No wound care   Complete by: As directed        Discharge Medications   Allergies as of 10/22/2022   No Known Allergies      Medication List     STOP taking these medications    furosemide 20 MG tablet Commonly known as: LASIX       TAKE these medications    allopurinol 300 MG tablet Commonly known as: ZYLOPRIM Take 1 tablet (300 mg total) by mouth daily. What changed:  when to take this reasons to take this   amoxicillin-clavulanate 875-125 MG tablet Commonly known as: AUGMENTIN Take 1 tablet by mouth 2 (two) times daily.   aspirin EC 81 MG tablet Generic drug: aspirin EC Take 81 mg by mouth daily.   clonazePAM 1 MG tablet Commonly known as: KLONOPIN TAKE 1 TABLET BY MOUTH TWICE A DAY AS NEEDED What changed: reasons to take this   famotidine-calcium carbonate-magnesium hydroxide 10-800-165 MG chewable tablet Commonly known as: PEPCID COMPLETE Chew 2 tablets by mouth daily as needed (indigestion).   Fish Oil 1000 MG Caps Take 1,000 mg by mouth daily.   fluticasone 50 MCG/ACT nasal spray Commonly known as: FLONASE 2 sprays by Nasal route daily.   hydrochlorothiazide 12.5 MG tablet Commonly known as: HYDRODIURIL Take 12.5 mg by mouth every morning.   lisinopril 20 MG tablet Commonly known as: ZESTRIL Take 20 mg by mouth  daily.   Multi Complete Caps Take 1 tablet by mouth daily.   Tart Cherry 1200 MG Caps  Take 1,200 mg by mouth daily.   traMADol 50 MG tablet Commonly known as: ULTRAM Take 50 mg by mouth every 6 (six) hours as needed for moderate pain.   ZyrTEC 10 MG tablet Generic drug: cetirizine Take 10 mg by mouth daily.         Follow-up Information     Hunsucker, Lesia Sago, MD Follow up in 1 week(s).   Specialty: Pulmonary Disease Why: We will call and set up appt for you for repeat CXR. Contact information: [address] [place] [address] [phone]         Alysia Penna, MD. Schedule an appointment as soon as possible for a visit in 1 week(s).   Specialty: Internal Medicine Contact information: [address] [place] [address] [phone]                 Major procedures and Radiology Reports - PLEASE review detailed and final reports thoroughly  -        DG CHEST PORT 1 VIEW  Result Date: 10/22/2022 CLINICAL DATA:  Chest tube taken out earlier today. EXAM: PORTABLE CHEST 1 VIEW COMPARISON:  5:30 a.m., earlier today FINDINGS: 12:06 p.m. The moderate left-sided pleural effusion with minimal loculation is similar, status post removal of pleural pigtail catheter. The previously described pleural air component is decreased to resolved. Equivocal trace pleural air identified. Midline trachea. Cardiomegaly accentuated by AP portable technique. Persistent left mid and lower lung airspace disease. Clear right lung. No congestive failure. IMPRESSION: Removal of left-sided pleural pigtail catheter. Similar moderate left pleural fluid with equivocal residual trace pleural air. Similar left base airspace disease, atelectasis or infection. Cardiomegaly without congestive failure. Electronically Signed   By: Jeronimo Greaves M.D.   On: 10/22/2022 12:18   DG CHEST PORT 1 VIEW  Result Date: 10/22/2022 CLINICAL DATA:  Follow-up chest tube. Evaluate  left pleural effusion. EXAM: PORTABLE CHEST 1 VIEW COMPARISON:  CT and x-ray from 10/21/2022 FINDINGS: There is a left-sided pigtail thoracostomy tube within the periphery of the left lower chest. Unchanged appearance of small loculated left hydropneumothorax with pleural thickening overlying the left upper and left lower lobe. Atelectasis within the basilar left upper lobe and left lower lobe is unchanged. Right lung appears clear. IMPRESSION: 1. No change in appearance of small loculated left hydropneumothorax. 2. Stable atelectasis within the basilar left upper lobe and left lower lobe. Electronically Signed   By: Signa Kell M.D.   On: 10/22/2022 07:02   CT CHEST WO CONTRAST  Result Date: 10/21/2022 CLINICAL DATA:  Pneumonia, complication suspected, xray done Shortness of breath. EXAM: CT CHEST WITHOUT CONTRAST TECHNIQUE: Multidetector CT imaging of the chest was performed following the standard protocol without IV contrast. RADIATION DOSE REDUCTION: This exam was performed according to the departmental dose-optimization program which includes automated exposure control, adjustment of the mA and/or kV according to patient size and/or use of iterative reconstruction technique. COMPARISON:  Recent radiographs. CTA 10/18/2022 FINDINGS: Cardiovascular: The thoracic aorta is normal in caliber. The heart is normal in size. Again seen coronary artery calcifications. No pericardial effusion. Mediastinum/Nodes: Scattered small mediastinal and prevascular nodes, not enlarged by size criteria. Assessment for hilar adenopathy is limited on this unenhanced exam. No visible thyroid nodule. Decompressed esophagus. Lungs/Pleura: Left pigtail catheter is coiled in the anterior left lung base. Diminished size of left pleural effusion with small volume of pleural fluid persisting. Small amount of fluid tracks into the left inter lobar fissure. Small amount of extrapleural  gas is likely related to procedure. Re-expansion of  the left lung. There are bandlike and ill-defined opacities throughout the left hemithorax. Slightly more confluent opacity in the dependent left lower lobe with air bronchograms. There is no obvious pulmonary mass. Minor dependent atelectasis in the right lower lobe, right lung is otherwise clear. The trachea and central airways are patent. Upper Abdomen: No acute findings. Musculoskeletal: Thoracic spondylosis with spurring. Bilateral glenohumeral osteoarthritis. No suspicious bone lesion. IMPRESSION: 1. Left pigtail catheter is coiled in the anterior left lung base. Diminished size of left pleural effusion with small volume of pleural fluid persisting. Small amount of extrapleural gas is likely related to procedure. 2. Re-expansion of the left lung with bandlike and ill-defined opacities throughout the left hemithorax. Slightly more confluent opacity in the dependent left lower lobe with air bronchograms. Lung base findings suspicious for pneumonia, additional opacities typical of atelectasis. 3. Coronary artery calcifications. Electronically Signed   By: Narda Rutherford M.D.   On: 10/21/2022 18:37   DG Chest Port 1 View  Result Date: 10/21/2022 CLINICAL DATA:  Hypoxia EXAM: PORTABLE CHEST 1 VIEW COMPARISON:  Chest x-ray dated October 21, 2022 FINDINGS: Visualized cardiac and mediastinal contours are unchanged. Small left pleural effusion with associated atelectasis, decreased in size when compared with prior exam. Left-sided chest tube in place. No evidence of pneumothorax. IMPRESSION: Small left pleural effusion, decreased in size when compared with prior exam. Electronically Signed   By: Allegra Lai M.D.   On: 10/21/2022 13:27   DG CHEST PORT 1 VIEW  Result Date: 10/21/2022 CLINICAL DATA:  Pleural effusion EXAM: PORTABLE CHEST 1 VIEW COMPARISON:  Previous studies including the examination of 10/20/2022 FINDINGS: Transverse diameter of heart is increased. There is moderate to large left pleural  effusion with interval decrease. Left chest tube is noted overlying the lateral aspect of left lower lung field. There are no new infiltrates in the visualized lung fields. Evaluation of left mid and left lower lung fields for infiltrates is limited by the effusion. There is no pneumothorax. IMPRESSION: There is interval decrease in left pleural effusion. There is no pneumothorax. Possibility of underlying atelectasis/pneumonia in the left mid and left lower lung fields is not excluded. Electronically Signed   By: Ernie Avena M.D.   On: 10/21/2022 08:16   DG CHEST PORT 1 VIEW  Result Date: 10/20/2022 CLINICAL DATA:  Chest tube placement EXAM: PORTABLE CHEST 1 VIEW COMPARISON:  Chest x-ray dated October 20, 2022 FINDINGS: Visualized cardiac and mediastinal contours are within normal limits. Moderate left pleural effusion is decreased in size status post chest tube placement. No evidence of pneumothorax. Similar mid lung atelectasis. IMPRESSION: Moderate left pleural effusion is decreased in size status post chest tube placement. No evidence of pneumothorax. Electronically Signed   By: Allegra Lai M.D.   On: 10/20/2022 13:04   DG Chest Port 1 View  Result Date: 10/20/2022 CLINICAL DATA:  Shortness of breath. EXAM: PORTABLE CHEST 1 VIEW COMPARISON:  10/19/2022 FINDINGS: Large left pleural effusion again noted with collapse/consolidation in the visualized lower left lung. Right lung clear. Cardiopericardial silhouette is stable. Telemetry leads overlie the chest. IMPRESSION: Large left pleural effusion with collapse/consolidation in the visualized lower left lung. Electronically Signed   By: Kennith Center M.D.   On: 10/20/2022 06:42   IR THORACENTESIS ASP PLEURAL SPACE W/IMG GUIDE  Result Date: 10/19/2022 INDICATION: Patient admitted for shortness of breath and found to have large left-sided pleural effusion. Interventional radiology asked to perform a  diagnostic and therapeutic thoracentesis.  EXAM: ULTRASOUND GUIDED THORACENTESIS MEDICATIONS: 1% lidocaine 15 mL COMPLICATIONS: None immediate. PROCEDURE: An ultrasound guided thoracentesis was thoroughly discussed with the patient and questions answered. The benefits, risks, alternatives and complications were also discussed. The patient understands and wishes to proceed with the procedure. Written consent was obtained. Ultrasound was performed to localize and mark an adequate pocket of fluid in the left chest. The area was then prepped and draped in the normal sterile fashion. 1% Lidocaine was used for local anesthesia. Under ultrasound guidance a 6 Fr Safe-T-Centesis catheter was introduced. Thoracentesis was performed. The catheter was removed and a dressing applied. FINDINGS: A total of approximately 1.1 L of dark yellow fluid was removed. Samples were sent to the laboratory as requested by the clinical team. IMPRESSION: Successful ultrasound guided left thoracentesis yielding 1.1 L of pleural fluid. Read by: Alwyn Ren, NP Electronically Signed   By: Malachy Moan M.D.   On: 10/19/2022 12:48   DG Chest 1 View  Result Date: 10/19/2022 CLINICAL DATA:  Status post left thoracentesis with removal of 1.1 L EXAM: CHEST  1 VIEW COMPARISON:  CT angio chest 10/18/2022 FINDINGS: The cardiac silhouette is obscured by the large left effusion. Decreased volume of left pleural effusion status post thoracentesis. Improved aeration to the left upper lobe. No signs of pneumothorax. Right lung appears clear. IMPRESSION: 1. No pneumothorax after left thoracentesis. Electronically Signed   By: Signa Kell M.D.   On: 10/19/2022 11:14   CT Angio Chest Pulmonary Embolism (PE) W or WO Contrast  Result Date: 10/18/2022 CLINICAL DATA:  Pleural effusion. EXAM: CT ANGIOGRAPHY CHEST WITH CONTRAST TECHNIQUE: Multidetector CT imaging of the chest was performed using the standard protocol during bolus administration of intravenous contrast. Multiplanar CT image  reconstructions and MIPs were obtained to evaluate the vascular anatomy. RADIATION DOSE REDUCTION: This exam was performed according to the departmental dose-optimization program which includes automated exposure control, adjustment of the mA and/or kV according to patient size and/or use of iterative reconstruction technique. CONTRAST:  75mL OMNIPAQUE IOHEXOL 350 MG/ML SOLN COMPARISON:  Rib radiographs 10/13/2022 FINDINGS: Cardiovascular: There is no pulmonary embolus to the segmental level. The subsegmental branches are not well assessed due to contrast bolus timing. The left pulmonary arteries are further limited due to compression related to atelectasis. Heart is normal in size. There is slight rightward displacement of the heart and mediastinal structures due to large left pleural effusion. There are coronary artery calcifications. No pericardial effusion. The thoracic aorta is normal in caliber with mild atherosclerosis. No dissection. Mediastinum/Nodes: Scattered mediastinal lymph nodes, largest in the anterior paratracheal station measuring 9 mm. No enlarged lymph nodes by size criteria. Left hilar assessment is limited due to adjacent pleural fluid and atelectasis. There is no obvious bulky adenopathy. The esophagus is decompressed. Lungs/Pleura: Large left pleural effusion which has increased in size from prior exam. Pleural fluid measures simple fluid density. There is no pleural enhancement. Effusion is partially loculated. There is complete compressive atelectasis of the left lower lobe and subtotal compressive atelectasis of the left upper lobe. Allowing for motion artifact the right lung is clear. There is no right pleural effusion. The trachea and central airways are patent. No pulmonary mass or nodule in the aerated lung, left lung assessment is limited due to compressive atelectasis. Upper Abdomen: No acute upper abdominal findings. Suspected hepatic steatosis which is not well assessed due to  phase of contrast. Musculoskeletal: Faint sclerotic density within the left lateral sixth  rib which has nonaggressive features. No suspicious bone lesion or rib fracture. Review of the MIP images confirms the above findings. IMPRESSION: 1. No pulmonary embolus to the segmental level. The subsegmental branches are not well assessed due to contrast bolus timing. 2. Large left pleural effusion which has increased in size from prior exam. There is complete compressive atelectasis of the left lower lobe and subtotal compressive atelectasis of the left upper lobe. Effusion causes mild mass effect and rightward medial shift of the heart and mediastinal structures. 3. The right lung is clear. 4. Aortic atherosclerosis.  Coronary artery calcifications. 5. Suspected hepatic steatosis which is not well assessed due to phase of contrast. Aortic Atherosclerosis (ICD10-I70.0). These results were called by telephone at the time of interpretation on 10/18/2022 at 7:28 pm to provider Timothy Lasso , who verbally acknowledged these results. Given the size of the effusion that has increased, the patient was referred to the emergency room for further evaluation and treatment. This information was conveyed to the covering technologist to direct the patient to the ER. Electronically Signed   By: Narda Rutherford M.D.   On: 10/18/2022 19:29   DG Ribs Unilateral W/Chest Left  Result Date: 10/13/2022 CLINICAL DATA:  Shortness of breath EXAM: LEFT RIBS AND CHEST - 3+ VIEW COMPARISON:  Radiograph 02/04/2011 FINDINGS: Mild enlarged cardiac silhouette. Left basilar airspace disease and small left pleural effusion. Right lung is clear. No evidence of pneumothorax. No acute osseous abnormality. Specifically, no evidence of displaced rib fracture. 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 IMPRESSION: Small left pleural effusion and adjacent basilar airspace disease, could be  pneumonia or atelectasis. No evidence of displaced rib fracture. Electronically Signed   By: Caprice Renshaw M.D.   On: 10/13/2022 09:21    Micro Results    Recent Results (from the past 240 hour(s))  Blood culture (routine x 2)     Status: None (Preliminary result)   Collection Time: 10/18/22  8:20 PM   Specimen: BLOOD  Result Value Ref Range Status   Specimen Description   Final    BLOOD BLOOD RIGHT HAND Performed at [facility], [address], [place], [place] [address]    Special Requests   Final    Blood Culture adequate volume BOTTLES DRAWN AEROBIC AND ANAEROBIC Performed at [facility], [address] [place], [place] [address]    Culture   Final    NO GROWTH 3 DAYS Performed at [facility], [address], [place], [place] [address]    Report Status PENDING  Incomplete  Blood culture (routine x 2)     Status: None (Preliminary result)   Collection Time: 10/18/22  8:38 PM   Specimen: BLOOD  Result Value Ref Range Status   Specimen Description   Final    BLOOD LEFT ANTECUBITAL Performed at [facility], [address] [place], [place] [address]    Special Requests   Final    Blood Culture adequate volume BOTTLES DRAWN AEROBIC AND ANAEROBIC Performed at [facility], [address], [place], [place] [address]    Culture   Final    NO GROWTH 3 DAYS Performed at [facility], [address] [place], [place] [address]    Report Status PENDING  Incomplete  Resp Panel by RT-PCR (Flu A&B, Covid) Anterior Nasal Swab     Status: None   Collection Time: 10/19/22  7:50 AM  Specimen: Anterior Nasal Swab  Result Value Ref Range Status   SARS Coronavirus 2 by RT PCR NEGATIVE NEGATIVE Final    Comment: (NOTE) SARS-CoV-2 target nucleic acids are NOT DETECTED.  The SARS-CoV-2 RNA is generally detectable in upper respiratory specimens during the acute phase of infection. The  lowest concentration of SARS-CoV-2 viral copies this assay can detect is 138 copies/mL. A negative result does not preclude SARS-Cov-2 infection and should not be used as the sole basis for treatment or other patient management decisions. A negative result may occur with  improper specimen collection/handling, submission of specimen other than nasopharyngeal swab, presence of viral mutation(s) within the areas targeted by this assay, and inadequate number of viral copies(<138 copies/mL). A negative result must be combined with clinical observations, patient history, and epidemiological information. The expected result is Negative.  Fact Sheet for Patients:  BloggerCourse.com  Fact Sheet for Healthcare Providers:  SeriousBroker.it  This test is no t yet approved or cleared by the [place] FDA and  has been authorized for detection and/or diagnosis of SARS-CoV-2 by FDA under an Emergency Use Authorization (EUA). This EUA will remain  in effect (meaning this test can be used) for the duration of the COVID-19 declaration under Section 564(b)(1) of the Act, 21 U.S.C.section 360bbb-3(b)(1), unless the authorization is terminated  or revoked sooner.       Influenza A by PCR NEGATIVE NEGATIVE Final   Influenza B by PCR NEGATIVE NEGATIVE Final    Comment: (NOTE) The Xpert Xpress SARS-CoV-2/FLU/RSV plus assay is intended as an aid in the diagnosis of influenza from Nasopharyngeal swab specimens and should not be used as a sole basis for treatment. Nasal washings and aspirates are unacceptable for Xpert Xpress SARS-CoV-2/FLU/RSV testing.  Fact Sheet for Patients: BloggerCourse.com  Fact Sheet for Healthcare Providers: SeriousBroker.it  This test is not yet approved or cleared by the [place] FDA and has been authorized for detection and/or diagnosis of SARS-CoV-2 by FDA under  an Emergency Use Authorization (EUA). This EUA will remain in effect (meaning this test can be used) for the duration of the COVID-19 declaration under Section 564(b)(1) of the Act, 21 U.S.C. section 360bbb-3(b)(1), unless the authorization is terminated or revoked.  Performed at [facility], [address], [place], [place] [address]   Gram stain     Status: None   Collection Time: 10/19/22 11:00 AM   Specimen: Lung, Left; Pleural Fluid  Result Value Ref Range Status   Specimen Description PLEURAL  Final   Special Requests NONE  Final   Gram Stain   Final    FEW WBC PRESENT, PREDOMINANTLY PMN NO ORGANISMS SEEN Performed at [facility], [address] [place], [place] [address]    Report Status 10/19/2022 FINAL  Final  Culture, body fluid w Gram Stain-bottle     Status: None (Preliminary result)   Collection Time: 10/19/22 11:01 AM   Specimen: Pleura  Result Value Ref Range Status   Specimen Description PLEURAL  Final   Special Requests NONE  Final   Culture   Final    NO GROWTH 3 DAYS Performed at [facility], [address] [place], [place] [address]    Report Status PENDING  Incomplete  MRSA Next Gen by PCR, Nasal     Status: None   Collection Time: 10/20/22  6:03 AM   Specimen: Nasal Mucosa; Nasal Swab  Result Value Ref Range Status   MRSA by PCR Next Gen NOT  DETECTED NOT DETECTED Final    Comment: (NOTE) The GeneXpert MRSA Assay (FDA approved for NASAL specimens only), is one component of a comprehensive MRSA colonization surveillance program. It is not intended to diagnose MRSA infection nor to guide or monitor treatment for MRSA infections. Test performance is not FDA approved in patients less than 52 years old. Performed at [facility], [address] [place], [place] [address]     Today   Subjective    Louis ChacoMichael Short today has no headache,no chest abdominal pain,no new weakness tingling or numbness, feels much  better wants to go home today.    Objective   Blood pressure 102/66, pulse 80, temperature (!) 97.4 F (36.3 C), temperature source Oral, resp. rate 20, height 6' (1.829 m), weight 119.8 kg, SpO2 92 %.   Intake/Output Summary (Last 24 hours) at 10/22/2022 1309 Last data filed at 10/22/2022 1000 Gross per 24 hour  Intake 480 ml  Output 1180 ml  Net -700 ml    Exam  Awake Alert, No new F.N deficits,    Marty.AT,PERRAL Supple Neck,   Symmetrical Chest wall movement, Good air movement bilaterally, CTAB RRR,No Gallops,   +ve B.Sounds, Abd Soft, Non tender,  No Cyanosis, Clubbing or edema    Data Review   Recent Labs  Lab 10/18/22 2042 10/19/22 0948 10/20/22 0635 10/21/22 0520 10/22/22 0240  WBC 14.9* 15.9* 11.2* 11.0* 11.3*  HGB 14.5 13.2 13.4 13.6 14.2  HCT 41.7 37.1* 38.7* 38.5* 40.6  PLT 354 376 389 374 419*  MCV 96.5 96.6 96.8 95.3 96.0  MCH 33.6 34.4* 33.5 33.7 33.6  MCHC 34.8 35.6 34.6 35.3 35.0  RDW 11.8 11.8 11.6 11.6 11.7  LYMPHSABS 1.0 0.9  --  1.5 1.7  MONOABS 1.5* 1.5*  --  1.1* 1.1*  EOSABS 0.0 0.1  --  0.2 0.2  BASOSABS 0.1 0.1  --  0.1 0.1    Recent Labs  Lab 10/18/22 2042 10/18/22 2043 10/18/22 2317 10/19/22 0948 10/20/22 0635 10/21/22 0520 10/22/22 0240  NA 133*  --   --  134* 136 135 133*  K 4.2  --   --  3.8 3.7 3.9 4.4  CL 94*  --   --  96* 98 103 98  CO2 26  --   --  27 27 24 26   GLUCOSE 107*  --   --  113* 94 103* 105*  BUN 16  --   --  14 15 15 12   CREATININE 0.89  --   --  0.88 0.99 0.97 1.06  CALCIUM 9.7  --   --  9.0 8.8* 8.6* 8.8*  AST 21  --   --   --  28 35 26  ALT 14  --   --   --  21 28 24   ALKPHOS 67  --   --   --  58 50 55  BILITOT 0.9  --   --   --  0.8 0.5 0.7  ALBUMIN 3.6  --   --   --  2.1* 2.1* 2.1*  MG  --   --   --   --  2.3 2.1 2.3  CRP  --   --   --   --  24.6* 16.8* 14.9*  PROCALCITON  --   --   --   --  0.47 0.27 0.47  LATICACIDVEN  --  1.0 0.9  --   --   --   --   BNP  --   --   --   --  20.8 85.6 14.6     Total Time in preparing paper work, data evaluation and todays exam - 35 minutes  Susa Raring M.D on 10/22/2022 at 1:09 PM  [facility]

## 2022-10-24 LAB — CULTURE, BODY FLUID W GRAM STAIN -BOTTLE: Culture: NO GROWTH

## 2022-10-24 LAB — CULTURE, BLOOD (ROUTINE X 2)
Culture: NO GROWTH
Culture: NO GROWTH
Special Requests: ADEQUATE
Special Requests: ADEQUATE

## 2022-10-27 LAB — MISC LABCORP TEST (SEND OUT): Labcorp test code: 9985

## 2022-11-02 ENCOUNTER — Ambulatory Visit (INDEPENDENT_AMBULATORY_CARE_PROVIDER_SITE_OTHER): Payer: No Typology Code available for payment source

## 2022-11-02 ENCOUNTER — Ambulatory Visit: Payer: No Typology Code available for payment source | Admitting: Adult Health

## 2022-11-02 ENCOUNTER — Encounter: Payer: Self-pay | Admitting: Adult Health

## 2022-11-02 VITALS — BP 110/70 | HR 87 | Temp 98.5°F | Ht 73.0 in | Wt 255.0 lb

## 2022-11-02 DIAGNOSIS — J90 Pleural effusion, not elsewhere classified: Secondary | ICD-10-CM

## 2022-11-02 NOTE — Patient Instructions (Addendum)
Finish Augmentin as directed.  Activity as tolerated.  Follow up in 3 weeks with chest xray with Dr. Francine Graven or Maura Braaten NP and As needed   Please contact office for sooner follow up if symptoms do not improve or worsen or seek emergency care

## 2022-11-02 NOTE — Assessment & Plan Note (Signed)
Large left pleural effusion suspected parapneumonic-the patient is improving on prolonged antibiotic course.  He is to finish up Augmentin.  Chest x-ray shows stable left effusion.  This has substantially improved since initial presentation.  We will continue with ongoing serial chest x-ray for clearance.  May need to repeat CT chest in 3 months. Patient is advised to advance activity as tolerated.  Good nutrition.  Good rest. He has no previous history of known lung disease.  Hold on PFTs at this time.  Plan  Patient Instructions  Finish Augmentin as directed.  Activity as tolerated.  Follow up in 3 weeks with chest xray with Dr. Francine Graven or Tamotsu Wiederholt NP and As needed   Please contact office for sooner follow up if symptoms do not improve or worsen or seek emergency care

## 2022-11-02 NOTE — Progress Notes (Signed)
@Patient  ID: Louis Short, male    DOB: 1971/09/11, 51 y.o.   MRN: [id number]  Chief Complaint  Patient presents with   Hospitalization Follow-up    Referring provider: [id number], MD  HPI: 51 year old male seen for pulmonary consult during hospitalization November 2023 for large left exudative pleural effusion  TEST/EVENTS :   11/02/2022 Follow up ; Pleural effusion and Post hospital follow up  Patient returns for a posthospital follow-up.  Patient was admitted earlier this month with sepsis secondary to a large left-sided exudative pleural effusion.  Felt to have had initial community-acquired pneumonia with parapneumonic effusion.  Patient was seen by pulmonary during hospitalization.  Underwent thoracentesis and pigtail catheter placement.  He was treated with IV antibiotics and transition to 2 weeks of additional Augmentin at discharge.  Pleural fluid culture was negative Feeling better.  Works from home. 11/04/2022 some for work, [employer] clients.  Declines vaccines.  No known dental issues .  Since discharge patient says he is feeling better.  He has returned back to work.  He is starting to do light activities.  Denies any cough or congestion.  Says he really never had much of a cough.  He does get winded and has decreased activity tolerance but this seems to be getting better.  Appetite is good with no nausea vomiting or diarrhea.  No joint pain or swelling no rash.  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 Chest x-ray today shows unchanged elevation of left hemidiaphragm with small left pleural effusion with associated scarring/atelectasis. Patient has 4 days of Augmentin left.  SH:  Former smoker quit 2001 , smoked 10 ~1 PPD .  Drink etoh - none since 11//12/2021.  No unusual hobbies. No birds/chickens/hot tub/basement .     No Known Allergies  Immunization History   Administered Date(s) Administered   Td 12/21/2010, 12/22/2010    Past Medical History:  Diagnosis Date   Allergy    GERD (gastroesophageal reflux disease)     Tobacco History: Social History   Tobacco Use  Smoking Status Former   Packs/day: 2.00   Years: 11.00   Total pack years: 22.00   Types: Cigarettes   Quit date: 12/14/1999   Years since quitting: 22.9  Smokeless Tobacco Never   Counseling given: Not Answered   Outpatient Medications Prior to Visit  Medication Sig Dispense Refill   amoxicillin-clavulanate (AUGMENTIN) 875-125 MG tablet Take 1 tablet by mouth 2 (two) times daily. 28 tablet 0   aspirin EC 81 MG tablet Take 81 mg by mouth daily.     cetirizine (ZYRTEC) 10 MG tablet Take 10 mg by mouth daily.     clonazePAM (KLONOPIN) 1 MG tablet TAKE 1 TABLET BY MOUTH TWICE A DAY AS NEEDED (Patient taking differently: Take 1 mg by mouth 2 (two) times daily as needed for anxiety.) 60 tablet 1   famotidine-calcium carbonate-magnesium hydroxide (PEPCID COMPLETE) 10-800-165 MG chewable tablet Chew 2 tablets by mouth daily as needed (indigestion).     fluticasone (FLONASE) 50 MCG/ACT nasal spray 2 sprays by Nasal route daily.       hydrochlorothiazide (HYDRODIURIL) 12.5 MG tablet Take 12.5 mg by mouth every morning.     lisinopril (ZESTRIL) 20 MG tablet Take 20 mg by mouth daily.     Multiple Vitamins-Minerals (MULTI COMPLETE) CAPS Take 1 tablet by mouth daily.     Omega-3 Fatty  Acids (FISH OIL) 1000 MG CAPS Take 1,000 mg by mouth daily.     Tart Cherry 1200 MG CAPS Take 1,200 mg by mouth daily.     allopurinol (ZYLOPRIM) 300 MG tablet Take 1 tablet (300 mg total) by mouth daily. (Patient not taking: Reported on 11/02/2022) 90 tablet 1   traMADol (ULTRAM) 50 MG tablet Take 50 mg by mouth every 6 (six) hours as needed for moderate pain. (Patient not taking: Reported on 11/02/2022)     No facility-administered medications prior to visit.     Review of Systems:    Constitutional:   No  weight loss, night sweats,  Fevers, chills,  +fatigue, or  lassitude.  HEENT:   No headaches,  Difficulty swallowing,  Tooth/dental problems, or  Sore throat,                No sneezing, itching, ear ache, nasal congestion, post nasal drip,   CV:  No chest pain,  Orthopnea, PND, swelling in lower extremities, anasarca, dizziness, palpitations, syncope.   GI  No heartburn, indigestion, abdominal pain, nausea, vomiting, diarrhea, change in bowel habits, loss of appetite, bloody stools.   Resp: No shortness of breath with exertion or at rest.  No excess mucus, no productive cough,  No non-productive cough,  No coughing up of blood.  No change in color of mucus.  No wheezing.  No chest wall deformity  Skin: no rash or lesions.  GU: no dysuria, change in color of urine, no urgency or frequency.  No flank pain, no hematuria   MS:  No joint pain or swelling.  No decreased range of motion.  No back pain.    Physical Exam  BP 110/70 (BP Location: Left Arm, Patient Position: Sitting, Cuff Size: Large)   Pulse 87   Temp 98.5 F (36.9 C) (Oral)   Ht 6\' 1"  (1.854 m)   Wt 255 lb (115.7 kg)   SpO2 98%   BMI 33.64 kg/m   GEN: A/Ox3; pleasant , NAD, well nourished    HEENT:  [place]/AT,  EACs-clear, TMs-wnl, NOSE-clear, THROAT-clear, no lesions, no postnasal drip or exudate noted.   NECK:  Supple w/ fair ROM; no JVD; normal carotid impulses w/o bruits; no thyromegaly or nodules palpated; no lymphadenopathy.    RESP decreased breath sounds in left base   no accessory muscle use, no dullness to percussion  CARD:  RRR, no m/r/g, no peripheral edema, pulses intact, no cyanosis or clubbing.  GI:   Soft & nt; nml bowel sounds; no organomegaly or masses detected.   Musco: Warm bil, no deformities or joint swelling noted.   Neuro: alert, no focal deficits noted.    Skin: Warm, no lesions or rashes    Lab Results:  CBC    Component Value Date/Time   WBC 11.3 (H)  10/22/2022 0240   RBC 4.23 10/22/2022 0240   HGB 14.2 10/22/2022 0240   HCT 40.6 10/22/2022 0240   PLT 419 (H) 10/22/2022 0240   MCV 96.0 10/22/2022 0240   MCH 33.6 10/22/2022 0240   MCHC 35.0 10/22/2022 0240   RDW 11.7 10/22/2022 0240   LYMPHSABS 1.7 10/22/2022 0240   MONOABS 1.1 (H) 10/22/2022 0240   EOSABS 0.2 10/22/2022 0240   BASOSABS 0.1 10/22/2022 0240    BMET    Component Value Date/Time   NA 133 (L) 10/22/2022 0240   K 4.4 10/22/2022 0240   CL 98 10/22/2022 0240   CO2 26 10/22/2022 0240   GLUCOSE  105 (H) 10/22/2022 0240   BUN 12 10/22/2022 0240   CREATININE 1.06 10/22/2022 0240   CALCIUM 8.8 (L) 10/22/2022 0240   GFRNONAA >60 10/22/2022 0240    BNP    Component Value Date/Time   BNP 14.6 10/22/2022 0240    ProBNP No results found for: "PROBNP"  Imaging: DG Chest 2 View  Result Date: 11/02/2022 CLINICAL DATA:  Follow-up pleural effusion EXAM: CHEST - 2 VIEW COMPARISON:  10/22/2022 FINDINGS: The heart size and mediastinal contours are within normal limits. Unchanged elevation of the left hemidiaphragm with a small left pleural effusion and associated scarring and or atelectasis. No new or acute appearing airspace opacity. Disc degenerative disease of the thoracic spine. IMPRESSION: Unchanged elevation of the left hemidiaphragm with a small left pleural effusion and associated scarring and or atelectasis. No new or acute appearing airspace opacity. Electronically Signed   By: Jearld Lesch M.D.   On: 11/02/2022 13:40   DG CHEST PORT 1 VIEW  Result Date: 10/22/2022 CLINICAL DATA:  Chest tube taken out earlier today. EXAM: PORTABLE CHEST 1 VIEW COMPARISON:  5:30 a.m., earlier today FINDINGS: 12:06 p.m. The moderate left-sided pleural effusion with minimal loculation is similar, status post removal of pleural pigtail catheter. The previously described pleural air component is decreased to resolved. Equivocal trace pleural air identified. Midline trachea. Cardiomegaly  accentuated by AP portable technique. Persistent left mid and lower lung airspace disease. Clear right lung. No congestive failure. IMPRESSION: Removal of left-sided pleural pigtail catheter. Similar moderate left pleural fluid with equivocal residual trace pleural air. Similar left base airspace disease, atelectasis or infection. Cardiomegaly without congestive failure. Electronically Signed   By: Jeronimo Greaves M.D.   On: 10/22/2022 12:18   DG CHEST PORT 1 VIEW  Result Date: 10/22/2022 CLINICAL DATA:  Follow-up chest tube. Evaluate left pleural effusion. EXAM: PORTABLE CHEST 1 VIEW COMPARISON:  CT and x-ray from 10/21/2022 FINDINGS: There is a left-sided pigtail thoracostomy tube within the periphery of the left lower chest. Unchanged appearance of small loculated left hydropneumothorax with pleural thickening overlying the left upper and left lower lobe. Atelectasis within the basilar left upper lobe and left lower lobe is unchanged. Right lung appears clear. IMPRESSION: 1. No change in appearance of small loculated left hydropneumothorax. 2. Stable atelectasis within the basilar left upper lobe and left lower lobe. Electronically Signed   By: Signa Kell M.D.   On: 10/22/2022 07:02   CT CHEST WO CONTRAST  Result Date: 10/21/2022 CLINICAL DATA:  Pneumonia, complication suspected, xray done Shortness of breath. EXAM: CT CHEST WITHOUT CONTRAST TECHNIQUE: Multidetector CT imaging of the chest was performed following the standard protocol without IV contrast. RADIATION DOSE REDUCTION: This exam was performed according to the departmental dose-optimization program which includes automated exposure control, adjustment of the mA and/or kV according to patient size and/or use of iterative reconstruction technique. COMPARISON:  Recent radiographs. CTA 10/18/2022 FINDINGS: Cardiovascular: The thoracic aorta is normal in caliber. The heart is normal in size. Again seen coronary artery calcifications. No  pericardial effusion. Mediastinum/Nodes: Scattered small mediastinal and prevascular nodes, not enlarged by size criteria. Assessment for hilar adenopathy is limited on this unenhanced exam. No visible thyroid nodule. Decompressed esophagus. Lungs/Pleura: Left pigtail catheter is coiled in the anterior left lung base. Diminished size of left pleural effusion with small volume of pleural fluid persisting. Small amount of fluid tracks into the left inter lobar fissure. Small amount of extrapleural gas is likely related to procedure. Re-expansion of the left  lung. There are bandlike and ill-defined opacities throughout the left hemithorax. Slightly more confluent opacity in the dependent left lower lobe with air bronchograms. There is no obvious pulmonary mass. Minor dependent atelectasis in the right lower lobe, right lung is otherwise clear. The trachea and central airways are patent. Upper Abdomen: No acute findings. Musculoskeletal: Thoracic spondylosis with spurring. Bilateral glenohumeral osteoarthritis. No suspicious bone lesion. IMPRESSION: 1. Left pigtail catheter is coiled in the anterior left lung base. Diminished size of left pleural effusion with small volume of pleural fluid persisting. Small amount of extrapleural gas is likely related to procedure. 2. Re-expansion of the left lung with bandlike and ill-defined opacities throughout the left hemithorax. Slightly more confluent opacity in the dependent left lower lobe with air bronchograms. Lung base findings suspicious for pneumonia, additional opacities typical of atelectasis. 3. Coronary artery calcifications. Electronically Signed   By: Narda Rutherford M.D.   On: 10/21/2022 18:37   DG Chest Port 1 View  Result Date: 10/21/2022 CLINICAL DATA:  Hypoxia EXAM: PORTABLE CHEST 1 VIEW COMPARISON:  Chest x-ray dated October 21, 2022 FINDINGS: Visualized cardiac and mediastinal contours are unchanged. Small left pleural effusion with associated  atelectasis, decreased in size when compared with prior exam. Left-sided chest tube in place. No evidence of pneumothorax. IMPRESSION: Small left pleural effusion, decreased in size when compared with prior exam. Electronically Signed   By: Allegra Lai M.D.   On: 10/21/2022 13:27   DG CHEST PORT 1 VIEW  Result Date: 10/21/2022 CLINICAL DATA:  Pleural effusion EXAM: PORTABLE CHEST 1 VIEW COMPARISON:  Previous studies including the examination of 10/20/2022 FINDINGS: Transverse diameter of heart is increased. There is moderate to large left pleural effusion with interval decrease. Left chest tube is noted overlying the lateral aspect of left lower lung field. There are no new infiltrates in the visualized lung fields. Evaluation of left mid and left lower lung fields for infiltrates is limited by the effusion. There is no pneumothorax. IMPRESSION: There is interval decrease in left pleural effusion. There is no pneumothorax. Possibility of underlying atelectasis/pneumonia in the left mid and left lower lung fields is not excluded. Electronically Signed   By: Ernie Avena M.D.   On: 10/21/2022 08:16   DG CHEST PORT 1 VIEW  Result Date: 10/20/2022 CLINICAL DATA:  Chest tube placement EXAM: PORTABLE CHEST 1 VIEW COMPARISON:  Chest x-ray dated October 20, 2022 FINDINGS: Visualized cardiac and mediastinal contours are within normal limits. Moderate left pleural effusion is decreased in size status post chest tube placement. No evidence of pneumothorax. Similar mid lung atelectasis. IMPRESSION: Moderate left pleural effusion is decreased in size status post chest tube placement. No evidence of pneumothorax. Electronically Signed   By: Allegra Lai M.D.   On: 10/20/2022 13:04   DG Chest Port 1 View  Result Date: 10/20/2022 CLINICAL DATA:  Shortness of breath. EXAM: PORTABLE CHEST 1 VIEW COMPARISON:  10/19/2022 FINDINGS: Large left pleural effusion again noted with collapse/consolidation in the  visualized lower left lung. Right lung clear. Cardiopericardial silhouette is stable. Telemetry leads overlie the chest. IMPRESSION: Large left pleural effusion with collapse/consolidation in the visualized lower left lung. Electronically Signed   By: Kennith Center M.D.   On: 10/20/2022 06:42   IR THORACENTESIS ASP PLEURAL SPACE W/IMG GUIDE  Result Date: 10/19/2022 INDICATION: Patient admitted for shortness of breath and found to have large left-sided pleural effusion. Interventional radiology asked to perform a diagnostic and therapeutic thoracentesis. EXAM: ULTRASOUND GUIDED THORACENTESIS MEDICATIONS: 1%  lidocaine 15 mL COMPLICATIONS: None immediate. PROCEDURE: An ultrasound guided thoracentesis was thoroughly discussed with the patient and questions answered. The benefits, risks, alternatives and complications were also discussed. The patient understands and wishes to proceed with the procedure. Written consent was obtained. Ultrasound was performed to localize and mark an adequate pocket of fluid in the left chest. The area was then prepped and draped in the normal sterile fashion. 1% Lidocaine was used for local anesthesia. Under ultrasound guidance a 6 Fr Safe-T-Centesis catheter was introduced. Thoracentesis was performed. The catheter was removed and a dressing applied. FINDINGS: A total of approximately 1.1 L of dark yellow fluid was removed. Samples were sent to the laboratory as requested by the clinical team. IMPRESSION: Successful ultrasound guided left thoracentesis yielding 1.1 L of pleural fluid. Read by: Alwyn Ren, NP Electronically Signed   By: Malachy Moan M.D.   On: 10/19/2022 12:48   DG Chest 1 View  Result Date: 10/19/2022 CLINICAL DATA:  Status post left thoracentesis with removal of 1.1 L EXAM: CHEST  1 VIEW COMPARISON:  CT angio chest 10/18/2022 FINDINGS: The cardiac silhouette is obscured by the large left effusion. Decreased volume of left pleural effusion status post  thoracentesis. Improved aeration to the left upper lobe. No signs of pneumothorax. Right lung appears clear. IMPRESSION: 1. No pneumothorax after left thoracentesis. Electronically Signed   By: Signa Kell M.D.   On: 10/19/2022 11:14   CT Angio Chest Pulmonary Embolism (PE) W or WO Contrast  Result Date: 10/18/2022 CLINICAL DATA:  Pleural effusion. EXAM: CT ANGIOGRAPHY CHEST WITH CONTRAST TECHNIQUE: Multidetector CT imaging of the chest was performed using the standard protocol during bolus administration of intravenous contrast. Multiplanar CT image reconstructions and MIPs were obtained to evaluate the vascular anatomy. RADIATION DOSE REDUCTION: This exam was performed according to the departmental dose-optimization program which includes automated exposure control, adjustment of the mA and/or kV according to patient size and/or use of iterative reconstruction technique. CONTRAST:  53mL OMNIPAQUE IOHEXOL 350 MG/ML SOLN COMPARISON:  Rib radiographs 10/13/2022 FINDINGS: Cardiovascular: There is no pulmonary embolus to the segmental level. The subsegmental branches are not well assessed due to contrast bolus timing. The left pulmonary arteries are further limited due to compression related to atelectasis. Heart is normal in size. There is slight rightward displacement of the heart and mediastinal structures due to large left pleural effusion. There are coronary artery calcifications. No pericardial effusion. The thoracic aorta is normal in caliber with mild atherosclerosis. No dissection. Mediastinum/Nodes: Scattered mediastinal lymph nodes, largest in the anterior paratracheal station measuring 9 mm. No enlarged lymph nodes by size criteria. Left hilar assessment is limited due to adjacent pleural fluid and atelectasis. There is no obvious bulky adenopathy. The esophagus is decompressed. Lungs/Pleura: Large left pleural effusion which has increased in size from prior exam. Pleural fluid measures simple fluid  density. There is no pleural enhancement. Effusion is partially loculated. There is complete compressive atelectasis of the left lower lobe and subtotal compressive atelectasis of the left upper lobe. Allowing for motion artifact the right lung is clear. There is no right pleural effusion. The trachea and central airways are patent. No pulmonary mass or nodule in the aerated lung, left lung assessment is limited due to compressive atelectasis. Upper Abdomen: No acute upper abdominal findings. Suspected hepatic steatosis which is not well assessed due to phase of contrast. Musculoskeletal: Faint sclerotic density within the left lateral sixth rib which has nonaggressive features. No suspicious bone lesion or rib  fracture. Review of the MIP images confirms the above findings. IMPRESSION: 1. No pulmonary embolus to the segmental level. The subsegmental branches are not well assessed due to contrast bolus timing. 2. Large left pleural effusion which has increased in size from prior exam. There is complete compressive atelectasis of the left lower lobe and subtotal compressive atelectasis of the left upper lobe. Effusion causes mild mass effect and rightward medial shift of the heart and mediastinal structures. 3. The right lung is clear. 4. Aortic atherosclerosis.  Coronary artery calcifications. 5. Suspected hepatic steatosis which is not well assessed due to phase of contrast. Aortic Atherosclerosis (ICD10-I70.0). These results were called by telephone at the time of interpretation on 10/18/2022 at 7:28 pm to provider Timothy Lasso , who verbally acknowledged these results. Given the size of the effusion that has increased, the patient was referred to the emergency room for further evaluation and treatment. This information was conveyed to the covering technologist to direct the patient to the ER. Electronically Signed   By: Narda Rutherford M.D.   On: 10/18/2022 19:29   DG Ribs Unilateral W/Chest Left  Result Date:  10/13/2022 CLINICAL DATA:  Shortness of breath EXAM: LEFT RIBS AND CHEST - 3+ VIEW COMPARISON:  Radiograph 02/04/2011 FINDINGS: Mild enlarged cardiac silhouette. Left basilar airspace disease and small left pleural effusion. Right lung is clear. No evidence of pneumothorax. No acute osseous abnormality. Specifically, no evidence of displaced rib fracture. 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 IMPRESSION: Small left pleural effusion and adjacent basilar airspace disease, could be pneumonia or atelectasis. No evidence of displaced rib fracture. Electronically Signed   By: Caprice Renshaw M.D.   On: 10/13/2022 09:21          No data to display          No results found for: "NITRICOXIDE"      Assessment & Plan:   Exudative pleural effusion Large left pleural effusion suspected parapneumonic-the patient is improving on prolonged antibiotic course.  He is to finish up Augmentin.  Chest x-ray shows stable left effusion.  This has substantially improved since initial presentation.  We will continue with ongoing serial chest x-ray for clearance.  May need to repeat CT chest in 3 months. Patient is advised to advance activity as tolerated.  Good nutrition.  Good rest. He has no previous history of known lung disease.  Hold on PFTs at this time.  Plan  Patient Instructions  Finish Augmentin as directed.  Activity as tolerated.  Follow up in 3 weeks with chest xray with Dr. Francine Graven or Hershal Eriksson NP and As needed   Please contact office for sooner follow up if symptoms do not improve or worsen or seek emergency care        Rubye Oaks, NP 11/02/2022

## 2022-11-23 ENCOUNTER — Ambulatory Visit: Payer: No Typology Code available for payment source | Admitting: Pulmonary Disease

## 2022-11-23 ENCOUNTER — Ambulatory Visit (INDEPENDENT_AMBULATORY_CARE_PROVIDER_SITE_OTHER): Payer: No Typology Code available for payment source

## 2022-11-23 ENCOUNTER — Encounter: Payer: Self-pay | Admitting: Pulmonary Disease

## 2022-11-23 VITALS — BP 132/78 | HR 74 | Ht 73.0 in | Wt 257.0 lb

## 2022-11-23 DIAGNOSIS — J90 Pleural effusion, not elsewhere classified: Secondary | ICD-10-CM

## 2022-11-23 NOTE — Progress Notes (Signed)
Synopsis: Referred in December 2023 for pleural effusion  Subjective:   PATIENT ID: Louis Short GENDER: male DOB: 22-Mar-1971, MRN: [id number]   HPI  Chief Complaint  Patient presents with   Follow-up    F/U    Marcoantonio Legault is a 51 year old male, former smoker with GERD who was admitted 11/7 to 11/10 pneumonia and large left parapneumonic pleural effusion s/p chest tube and pleural lytic therapy who returns to pulmonary clinic for follow up.  He was seen by Rubye Oaks, NP on 11/02/22 where he was towards the tail end of a prolonged augmentin course since his discharge. He was feeling better at that time. He continues to feel better and reports he is near his baseline prior to admission. He has no cough, wheezing, dyspnea or chest pain. He has cut back significantly on his alcohol intake since hospitalization. Chest x-ray today shows elevated left hemidiaphragm with small left pleural effusion.  He works in [employer] and travels a lot for work where he was drinking alcohol quite frequently. He quit smoking in 2001. He is typically active doing yard work at home.  Past Medical History:  Diagnosis Date   Allergy    GERD (gastroesophageal reflux disease)      Family History  Problem Relation Age of Onset   Arthritis Other    Diabetes Other        1st degree relative   Allergies Brother      Social History   Socioeconomic History   Marital status: Married    Spouse name: Not on file   Number of children: Not on file   Years of education: Not on file   Highest education level: Not on file  Occupational History   Not on file  Tobacco Use   Smoking status: Former    Packs/day: 2.00    Years: 11.00    Total pack years: 22.00    Types: Cigarettes    Quit date: 12/14/1999    Years since quitting: 22.9   Smokeless tobacco: Never  Substance and Sexual Activity   Alcohol use: No    Comment: 12 drinks per week   Drug use: No   Sexual activity:  Yes  Other Topics Concern   Not on file  Social History Narrative   Regular exercise-yes   Married with children   Social Determinants of Health   Financial Resource Strain: Not on file  Food Insecurity: No Food Insecurity (10/19/2022)   Hunger Vital Sign    Worried About Running Out of Food in the Last Year: Never true    Ran Out of Food in the Last Year: Never true  Transportation Needs: No Transportation Needs (10/19/2022)   PRAPARE - Administrator, Civil Service (Medical): No    Lack of Transportation (Non-Medical): No  Physical Activity: Not on file  Stress: Not on file  Social Connections: Not on file  Intimate Partner Violence: Not At Risk (10/19/2022)   Humiliation, Afraid, Rape, and Kick questionnaire    Fear of Current or Ex-Partner: No    Emotionally Abused: No    Physically Abused: No    Sexually Abused: No     No Known Allergies   Outpatient Medications Prior to Visit  Medication Sig Dispense Refill   aspirin EC 81 MG tablet Take 81 mg by mouth daily.     cetirizine (ZYRTEC) 10 MG tablet Take 10 mg by mouth daily.     clonazePAM (KLONOPIN)  1 MG tablet TAKE 1 TABLET BY MOUTH TWICE A DAY AS NEEDED (Patient taking differently: Take 1 mg by mouth 2 (two) times daily as needed for anxiety.) 60 tablet 1   famotidine-calcium carbonate-magnesium hydroxide (PEPCID COMPLETE) 10-800-165 MG chewable tablet Chew 2 tablets by mouth daily as needed (indigestion).     fluticasone (FLONASE) 50 MCG/ACT nasal spray 2 sprays by Nasal route daily.       hydrochlorothiazide (HYDRODIURIL) 12.5 MG tablet Take 12.5 mg by mouth every morning.     lisinopril (ZESTRIL) 20 MG tablet Take 20 mg by mouth daily.     Multiple Vitamins-Minerals (MULTI COMPLETE) CAPS Take 1 tablet by mouth daily.     Omega-3 Fatty Acids (FISH OIL) 1000 MG CAPS Take 1,000 mg by mouth daily.     Tart Cherry 1200 MG CAPS Take 1,200 mg by mouth daily.     allopurinol (ZYLOPRIM) 300 MG tablet Take 1 tablet  (300 mg total) by mouth daily. (Patient not taking: Reported on 11/02/2022) 90 tablet 1   amoxicillin-clavulanate (AUGMENTIN) 875-125 MG tablet Take 1 tablet by mouth 2 (two) times daily. 28 tablet 0   No facility-administered medications prior to visit.    Review of Systems  Constitutional:  Negative for chills, fever, malaise/fatigue and weight loss.  HENT:  Negative for congestion, sinus pain and sore throat.   Eyes: Negative.   Respiratory:  Negative for cough, hemoptysis, sputum production, shortness of breath and wheezing.   Cardiovascular:  Negative for chest pain, palpitations, orthopnea, claudication and leg swelling.  Gastrointestinal:  Negative for abdominal pain, heartburn, nausea and vomiting.  Genitourinary: Negative.   Musculoskeletal:  Negative for joint pain and myalgias.  Skin:  Negative for rash.  Neurological:  Negative for weakness.  Endo/Heme/Allergies: Negative.   Psychiatric/Behavioral: Negative.        Objective:   Vitals:   11/23/22 1352  BP: 132/78  Pulse: 74  SpO2: 98%  Weight: 257 lb (116.6 kg)  Height: 6\' 1"  (1.854 m)     Physical Exam Constitutional:      General: He is not in acute distress. HENT:     Head: Normocephalic and atraumatic.  Eyes:     Extraocular Movements: Extraocular movements intact.     Conjunctiva/sclera: Conjunctivae normal.     Pupils: Pupils are equal, round, and reactive to light.  Cardiovascular:     Rate and Rhythm: Normal rate and regular rhythm.     Pulses: Normal pulses.     Heart sounds: Normal heart sounds. No murmur heard. Pulmonary:     Effort: Pulmonary effort is normal.     Breath sounds: Normal breath sounds.  Abdominal:     General: Bowel sounds are normal.     Palpations: Abdomen is soft.  Musculoskeletal:     Right lower leg: No edema.     Left lower leg: No edema.  Lymphadenopathy:     Cervical: No cervical adenopathy.  Skin:    General: Skin is warm and dry.  Neurological:     General:  No focal deficit present.     Mental Status: He is alert.  Psychiatric:        Mood and Affect: Mood normal.        Behavior: Behavior normal.        Thought Content: Thought content normal.        Judgment: Judgment normal.       CBC    Component Value Date/Time   WBC 11.3 (H)  10/22/2022 0240   RBC 4.23 10/22/2022 0240   HGB 14.2 10/22/2022 0240   HCT 40.6 10/22/2022 0240   PLT 419 (H) 10/22/2022 0240   MCV 96.0 10/22/2022 0240   MCH 33.6 10/22/2022 0240   MCHC 35.0 10/22/2022 0240   RDW 11.7 10/22/2022 0240   LYMPHSABS 1.7 10/22/2022 0240   MONOABS 1.1 (H) 10/22/2022 0240   EOSABS 0.2 10/22/2022 0240   BASOSABS 0.1 10/22/2022 0240     Chest imaging: CXR 11/23/22 Right lung is normal in appearance. Cardiac and mediastinal contours are unchanged. Compared to the most recent chest radiograph dated 11/03/2019 there is interval improvement in aeration of the left lung. There is persistent asymmetric elevation of the left hemidiaphragm. There is also thickening along the left costophrenic angle, which has improved, but is persistent. No pneumothorax. No new focal airspace opacity. No displaced rib fractures. Visualized upper abdomen is unremarkable.   PFT:     No data to display          Labs: Pleural fluid 11/7:  LDH 756 Cell count: 2,200 with 90% neutrophils  Path:  Echo:  Heart Catheterization:  Assessment & Plan:   Exudative pleural effusion - Plan: CT Chest Wo Contrast  Discussion: Louis Short is a 51 year old male, former smoker with GERD who was admitted 11/7 to 11/10 pneumonia and large left parapneumonic pleural effusion s/p chest tube and pleural lytic therapy who returns to pulmonary clinic for follow up.  He has had good recovery since discharge and completion of prolonged course of augmentin. He continues to have elevated left hemidiaphragm and left pleural thickening with small pleural effusion. We will obtain a CT Chest in 2 months with  follow up to establish a new baseline appearance of the left lung/pleural space.  Follow up in 2 months after CT Chest.  Melody Comas, MD [facility] Office: [phone]     Current Outpatient Medications:    aspirin EC 81 MG tablet, Take 81 mg by mouth daily., Disp: , Rfl:    cetirizine (ZYRTEC) 10 MG tablet, Take 10 mg by mouth daily., Disp: , Rfl:    clonazePAM (KLONOPIN) 1 MG tablet, TAKE 1 TABLET BY MOUTH TWICE A DAY AS NEEDED (Patient taking differently: Take 1 mg by mouth 2 (two) times daily as needed for anxiety.), Disp: 60 tablet, Rfl: 1   famotidine-calcium carbonate-magnesium hydroxide (PEPCID COMPLETE) 10-800-165 MG chewable tablet, Chew 2 tablets by mouth daily as needed (indigestion)., Disp: , Rfl:    fluticasone (FLONASE) 50 MCG/ACT nasal spray, 2 sprays by Nasal route daily.  , Disp: , Rfl:    hydrochlorothiazide (HYDRODIURIL) 12.5 MG tablet, Take 12.5 mg by mouth every morning., Disp: , Rfl:    lisinopril (ZESTRIL) 20 MG tablet, Take 20 mg by mouth daily., Disp: , Rfl:    Multiple Vitamins-Minerals (MULTI COMPLETE) CAPS, Take 1 tablet by mouth daily., Disp: , Rfl:    Omega-3 Fatty Acids (FISH OIL) 1000 MG CAPS, Take 1,000 mg by mouth daily., Disp: , Rfl:    Tart Cherry 1200 MG CAPS, Take 1,200 mg by mouth daily., Disp: , Rfl:

## 2022-11-23 NOTE — Patient Instructions (Addendum)
Your pleural effusion continues to slowly improve  We will check a CT chest scan in 2 months  Follow up after CT Chest scan

## 2022-11-27 ENCOUNTER — Encounter: Payer: Self-pay | Admitting: Pulmonary Disease

## 2022-12-02 ENCOUNTER — Other Ambulatory Visit (HOSPITAL_COMMUNITY): Payer: Self-pay

## 2023-01-20 ENCOUNTER — Encounter (HOSPITAL_COMMUNITY): Payer: Self-pay

## 2023-01-20 ENCOUNTER — Ambulatory Visit (HOSPITAL_COMMUNITY): Payer: 59

## 2023-01-25 ENCOUNTER — Ambulatory Visit: Payer: No Typology Code available for payment source | Admitting: Pulmonary Disease
